# Patient Record
Sex: Male | Born: 1960 | Race: White | Hispanic: No | Marital: Married | Smoking: Never smoker
Health system: Southern US, Community
[De-identification: ages and names within clinical notes are randomized; demographics above are authoritative.]

## PROBLEM LIST (undated history)

## (undated) DIAGNOSIS — M1990 Unspecified osteoarthritis, unspecified site: Secondary | ICD-10-CM

## (undated) DIAGNOSIS — G4730 Sleep apnea, unspecified: Secondary | ICD-10-CM

## (undated) DIAGNOSIS — L0390 Cellulitis, unspecified: Secondary | ICD-10-CM

## (undated) HISTORY — PX: JOINT REPLACEMENT: SHX530

## (undated) HISTORY — PX: OTHER SURGICAL HISTORY: SHX169

## (undated) HISTORY — PX: CARPAL TUNNEL RELEASE: SHX101

---

## 2014-06-03 NOTE — Progress Notes (Signed)
Called Dr. Blackman's office requested order be put in Epic surgery 06-14-14 pre op 06-06-14 Thanks 

## 2014-06-04 ENCOUNTER — Other Ambulatory Visit (HOSPITAL_COMMUNITY): Payer: Self-pay | Admitting: Orthopaedic Surgery

## 2014-06-04 NOTE — Patient Instructions (Addendum)
Jon MassyRobert B Rice  06/04/2014   Your procedure is scheduled on: 06/14/2014    [facility] and follow signs to               Short Stay Center at     0530 AM.  Call this number if you have problems the morning of surgery [phone]   Remember:  Do not eat food or drink liquids :After Midnight.     Take these medicines the morning of surgery with A SIP OF WATER: Zantac                                You may not have any metal on your body including hair pins and              piercings  Do not wear jewelry, , lotions, powders or perfumes.                        Men may shave face and neck.   Do not bring valuables to the hospital. [facility] IS NOT             RESPONSIBLE   FOR VALUABLES.  Contacts, dentures or bridgework may not be worn into surgery.  Leave suitcase in the car. After surgery it may be brought to your room.         Special Instructions: coughing and deep breathing exercises              Please read over the following fact sheets you were given: _____________________________________________________________________             [facility] - Preparing for Surgery Before surgery, you can play an important role.  Because skin is not sterile, your skin needs to be as free of germs as possible.  You can reduce the number of germs on your skin by washing with CHG (chlorahexidine gluconate) soap before surgery.  CHG is an antiseptic cleaner which kills germs and bonds with the skin to continue killing germs even after washing. Please DO NOT use if you have an allergy to CHG or antibacterial soaps.  If your skin becomes reddened/irritated stop using the CHG and inform your nurse when you arrive at Short Stay. Do not shave (including legs and underarms) for at least 48 hours prior to the first CHG shower.  You may shave your face/neck. Please follow these instructions carefully:  1.  Shower with CHG Soap the night before  surgery and the  morning of Surgery.  2.  If you choose to wash your hair, wash your hair first as usual with your  normal  shampoo.  3.  After you shampoo, rinse your hair and body thoroughly to remove the  shampoo.                           4.  Use CHG as you would any other liquid soap.  You can apply chg directly  to the skin and wash                       Gently with a scrungie or clean washcloth.  5.  Apply the CHG Soap to your body ONLY FROM THE NECK DOWN.   Do not  use on face/ open                           Wound or open sores. Avoid contact with eyes, ears mouth and genitals (private parts).                       Wash face,  Genitals (private parts) with your normal soap.             6.  Wash thoroughly, paying special attention to the area where your surgery  will be performed.  7.  Thoroughly rinse your body with warm water from the neck down.  8.  DO NOT shower/wash with your normal soap after using and rinsing off  the CHG Soap.                9.  Pat yourself dry with a clean towel.            10.  Wear clean pajamas.            11.  Place clean sheets on your bed the night of your first shower and do not  sleep with pets. Day of Surgery : Do not apply any lotions/deodorants the morning of surgery.  Please wear clean clothes to the hospital/surgery center.  FAILURE TO FOLLOW THESE INSTRUCTIONS MAY RESULT IN THE CANCELLATION OF YOUR SURGERY PATIENT SIGNATURE_________________________________  NURSE SIGNATURE__________________________________  ________________________________________________________________________  WHAT IS A BLOOD TRANSFUSION? Blood Transfusion Information  A transfusion is the replacement of blood or some of its parts. Blood is made up of multiple cells which provide different functions.  Red blood cells carry oxygen and are used for blood loss replacement.  White blood cells fight against infection.  Platelets control bleeding.  Plasma helps clot  blood.  Other blood products are available for specialized needs, such as hemophilia or other clotting disorders. BEFORE THE TRANSFUSION  Who gives blood for transfusions?   Healthy volunteers who are fully evaluated to make sure their blood is safe. This is blood bank blood. Transfusion therapy is the safest it has ever been in the practice of medicine. Before blood is taken from a donor, a complete history is taken to make sure that person has no history of diseases nor engages in risky social behavior (examples are intravenous drug use or sexual activity with multiple partners). The donor's travel history is screened to minimize risk of transmitting infections, such as malaria. The donated blood is tested for signs of infectious diseases, such as HIV and hepatitis. The blood is then tested to be sure it is compatible with you in order to minimize the chance of a transfusion reaction. If you or a relative donates blood, this is often done in anticipation of surgery and is not appropriate for emergency situations. It takes many days to process the donated blood. RISKS AND COMPLICATIONS Although transfusion therapy is very safe and saves many lives, the main dangers of transfusion include:   Getting an infectious disease.  Developing a transfusion reaction. This is an allergic reaction to something in the blood you were given. Every precaution is taken to prevent this. The decision to have a blood transfusion has been considered carefully by your caregiver before blood is given. Blood is not given unless the benefits outweigh the risks. AFTER THE TRANSFUSION  Right after receiving a blood transfusion, you will usually feel much better and more energetic. This is especially true if  your red blood cells have gotten low (anemic). The transfusion raises the level of the red blood cells which carry oxygen, and this usually causes an energy increase.  The nurse administering the transfusion will monitor  you carefully for complications. HOME CARE INSTRUCTIONS  No special instructions are needed after a transfusion. You may find your energy is better. Speak with your caregiver about any limitations on activity for underlying diseases you may have. SEEK MEDICAL CARE IF:   Your condition is not improving after your transfusion.  You develop redness or irritation at the intravenous (IV) site. SEEK IMMEDIATE MEDICAL CARE IF:  Any of the following symptoms occur over the next 12 hours:  Shaking chills.  You have a temperature by mouth above 102 F (38.9 C), not controlled by medicine.  Chest, back, or muscle pain.  People around you feel you are not acting correctly or are confused.  Shortness of breath or difficulty breathing.  Dizziness and fainting.  You get a rash or develop hives.  You have a decrease in urine output.  Your urine turns a dark color or changes to pink, red, or brown. Any of the following symptoms occur over the next 10 days:  You have a temperature by mouth above 102 F (38.9 C), not controlled by medicine.  Shortness of breath.  Weakness after normal activity.  The white part of the eye turns yellow (jaundice).  You have a decrease in the amount of urine or are urinating less often.  Your urine turns a dark color or changes to pink, red, or brown. Document Released: 05/07/2000 Document Revised: 08/02/2011 Document Reviewed: 12/25/2007 [facility] Patient Information 2014 [place], [place].  _______________________________________________________________________

## 2014-06-04 NOTE — Progress Notes (Signed)
Surgery on 06/14/13.  Preop on 06/06/14.  Need orders in EPIC.  Thank You.

## 2014-06-06 ENCOUNTER — Encounter (HOSPITAL_COMMUNITY)
Admission: RE | Admit: 2014-06-06 | Discharge: 2014-06-06 | Disposition: A | Discharge status: Home / Self Care | Payer: Managed Care, Other (non HMO) | Source: Ambulatory Visit | Attending: Orthopaedic Surgery | Admitting: Orthopaedic Surgery

## 2014-06-06 ENCOUNTER — Ambulatory Visit (HOSPITAL_COMMUNITY)
Admission: RE | Admit: 2014-06-06 | Discharge: 2014-06-06 | Disposition: A | Discharge status: Home / Self Care | Payer: Managed Care, Other (non HMO) | Source: Ambulatory Visit | Attending: Orthopaedic Surgery | Admitting: Orthopaedic Surgery

## 2014-06-06 ENCOUNTER — Encounter (HOSPITAL_COMMUNITY): Payer: Self-pay

## 2014-06-06 DIAGNOSIS — Z01818 Encounter for other preprocedural examination: Secondary | ICD-10-CM | POA: Insufficient documentation

## 2014-06-06 DIAGNOSIS — I10 Essential (primary) hypertension: Secondary | ICD-10-CM | POA: Insufficient documentation

## 2014-06-06 HISTORY — DX: Sleep apnea, unspecified: G47.30

## 2014-06-06 HISTORY — DX: Unspecified osteoarthritis, unspecified site: M19.90

## 2014-06-06 LAB — SURGICAL PCR SCREEN
MRSA, PCR: NEGATIVE
Staphylococcus aureus: NEGATIVE

## 2014-06-06 LAB — BASIC METABOLIC PANEL
ANION GAP: 11 (ref 5–15)
BUN: 10 mg/dL (ref 6–23)
CO2: 31 mmol/L (ref 19–32)
Calcium: 9.7 mg/dL (ref 8.4–10.5)
Chloride: 98 mEq/L (ref 96–112)
Creatinine, Ser: 0.91 mg/dL (ref 0.50–1.35)
GFR calc non Af Amer: >90 mL/min (ref >90)
GLUCOSE: 95 mg/dL (ref 70–99)
Potassium: 3.4 mmol/L — ABNORMAL LOW (ref 3.5–5.1)
SODIUM: 140 mmol/L (ref 135–145)

## 2014-06-06 LAB — PROTIME-INR
INR: 0.97 (ref 0.00–1.49)
Prothrombin Time: 13 seconds (ref 11.6–15.2)

## 2014-06-06 LAB — URINALYSIS, ROUTINE W REFLEX MICROSCOPIC
Bilirubin Urine: NEGATIVE
Glucose, UA: NEGATIVE mg/dL
HGB URINE DIPSTICK: NEGATIVE
Ketones, ur: NEGATIVE mg/dL
Leukocytes, UA: NEGATIVE
Nitrite: NEGATIVE
PH: 7 (ref 5.0–8.0)
Protein, ur: NEGATIVE mg/dL
Specific Gravity, Urine: 1.01 (ref 1.005–1.030)
Urobilinogen, UA: 0.2 mg/dL (ref 0.0–1.0)

## 2014-06-06 LAB — CBC
HCT: 51.2 % (ref 39.0–52.0)
HEMOGLOBIN: 17.4 g/dL — AB (ref 13.0–17.0)
MCH: 31.9 pg (ref 26.0–34.0)
MCHC: 34 g/dL (ref 30.0–36.0)
MCV: 93.8 fL (ref 78.0–100.0)
Platelets: 341 10*3/uL (ref 150–400)
RBC: 5.46 MIL/uL (ref 4.22–5.81)
RDW: 13.2 % (ref 11.5–15.5)
WBC: 12.2 10*3/uL — ABNORMAL HIGH (ref 4.0–10.5)

## 2014-06-06 LAB — APTT: APTT: 30 s (ref 24–37)

## 2014-06-06 NOTE — Progress Notes (Signed)
CBC doen 06/06/2014 faxed via EPIC to Dr Allie Bossierhris Blackman.

## 2014-06-06 NOTE — Progress Notes (Signed)
Final EKG done 06/06/14 in EPIC.

## 2014-06-14 ENCOUNTER — Inpatient Hospital Stay (HOSPITAL_COMMUNITY): Payer: Managed Care, Other (non HMO) | Admitting: Anesthesiology

## 2014-06-14 ENCOUNTER — Inpatient Hospital Stay (HOSPITAL_COMMUNITY): Payer: Managed Care, Other (non HMO)

## 2014-06-14 ENCOUNTER — Encounter (HOSPITAL_COMMUNITY)
Admission: RE | Disposition: A | Discharge status: Home Health | Payer: Self-pay | Source: Ambulatory Visit | Attending: Orthopaedic Surgery

## 2014-06-14 ENCOUNTER — Encounter (HOSPITAL_COMMUNITY): Payer: Self-pay | Admitting: *Deleted

## 2014-06-14 ENCOUNTER — Inpatient Hospital Stay (HOSPITAL_COMMUNITY)
Admission: RE | Admit: 2014-06-14 | Discharge: 2014-06-16 | DRG: 470 | Disposition: A | Discharge status: Home Health | Payer: Managed Care, Other (non HMO) | Source: Ambulatory Visit | Attending: Orthopaedic Surgery | Admitting: Orthopaedic Surgery

## 2014-06-14 DIAGNOSIS — M1612 Unilateral primary osteoarthritis, left hip: Secondary | ICD-10-CM | POA: Diagnosis present

## 2014-06-14 DIAGNOSIS — M25552 Pain in left hip: Secondary | ICD-10-CM | POA: Diagnosis present

## 2014-06-14 DIAGNOSIS — Z87891 Personal history of nicotine dependence: Secondary | ICD-10-CM | POA: Diagnosis not present

## 2014-06-14 DIAGNOSIS — Z419 Encounter for procedure for purposes other than remedying health state, unspecified: Secondary | ICD-10-CM

## 2014-06-14 DIAGNOSIS — Z6841 Body Mass Index (BMI) 40.0 and over, adult: Secondary | ICD-10-CM | POA: Diagnosis not present

## 2014-06-14 DIAGNOSIS — G4730 Sleep apnea, unspecified: Secondary | ICD-10-CM | POA: Diagnosis present

## 2014-06-14 DIAGNOSIS — Z96641 Presence of right artificial hip joint: Secondary | ICD-10-CM | POA: Diagnosis present

## 2014-06-14 DIAGNOSIS — Z96642 Presence of left artificial hip joint: Secondary | ICD-10-CM

## 2014-06-14 DIAGNOSIS — Z79899 Other long term (current) drug therapy: Secondary | ICD-10-CM

## 2014-06-14 HISTORY — PX: TOTAL HIP ARTHROPLASTY: SHX124

## 2014-06-14 LAB — TYPE AND SCREEN
ABO/RH(D): A POS
Antibody Screen: NEGATIVE

## 2014-06-14 LAB — ABO/RH: ABO/RH(D): A POS

## 2014-06-14 SURGERY — ARTHROPLASTY, HIP, TOTAL, ANTERIOR APPROACH
Anesthesia: Spinal | Site: Hip | Laterality: Left

## 2014-06-14 MED ORDER — METOCLOPRAMIDE HCL 10 MG PO TABS: 5.0000 mg | ORAL_TABLET | Freq: Three times a day (TID) | ORAL | Status: DC | PRN

## 2014-06-14 MED ORDER — FENTANYL CITRATE 0.05 MG/ML IJ SOLN
INTRAMUSCULAR | Status: DC | PRN
  Administered 2014-06-14: 100 ug via INTRAVENOUS

## 2014-06-14 MED ORDER — ACETAMINOPHEN 650 MG RE SUPP: 650.0000 mg | Freq: Four times a day (QID) | RECTAL | Status: DC | PRN

## 2014-06-14 MED ORDER — ASPIRIN EC 325 MG PO TBEC
325.0000 mg | DELAYED_RELEASE_TABLET | Freq: Two times a day (BID) | ORAL | Status: DC
  Administered 2014-06-14 – 2014-06-16 (×4): 325 mg via ORAL
  Filled 2014-06-14 (×6): qty 1

## 2014-06-14 MED ORDER — DOCUSATE SODIUM 100 MG PO CAPS
100.0000 mg | ORAL_CAPSULE | Freq: Two times a day (BID) | ORAL | Status: DC
  Administered 2014-06-14 – 2014-06-16 (×4): 100 mg via ORAL

## 2014-06-14 MED ORDER — MIDAZOLAM HCL 2 MG/2ML IJ SOLN
INTRAMUSCULAR | Status: AC
  Filled 2014-06-14: qty 2

## 2014-06-14 MED ORDER — SORBITOL 70 % SOLN
30.0000 mL | Freq: Every day | Status: DC | PRN
  Filled 2014-06-14: qty 30

## 2014-06-14 MED ORDER — ATENOLOL 100 MG PO TABS
100.0000 mg | ORAL_TABLET | Freq: Every day | ORAL | Status: DC
  Administered 2014-06-14 – 2014-06-15 (×2): 100 mg via ORAL
  Filled 2014-06-14 (×3): qty 1

## 2014-06-14 MED ORDER — PHENYLEPHRINE 40 MCG/ML (10ML) SYRINGE FOR IV PUSH (FOR BLOOD PRESSURE SUPPORT)
PREFILLED_SYRINGE | INTRAVENOUS | Status: AC
  Filled 2014-06-14: qty 10

## 2014-06-14 MED ORDER — GABAPENTIN 300 MG PO CAPS
300.0000 mg | ORAL_CAPSULE | Freq: Two times a day (BID) | ORAL | Status: DC
  Administered 2014-06-14 – 2014-06-16 (×4): 300 mg via ORAL
  Filled 2014-06-14 (×5): qty 1

## 2014-06-14 MED ORDER — CEFAZOLIN SODIUM 10 G IJ SOLR
3.0000 g | INTRAMUSCULAR | Status: AC
  Administered 2014-06-14: 3 g via INTRAVENOUS
  Filled 2014-06-14 (×2): qty 3000

## 2014-06-14 MED ORDER — HYDROMORPHONE HCL 1 MG/ML IJ SOLN: 1.0000 mg | INTRAMUSCULAR | Status: DC | PRN

## 2014-06-14 MED ORDER — ZOLPIDEM TARTRATE 5 MG PO TABS: 5.0000 mg | ORAL_TABLET | Freq: Every evening | ORAL | Status: DC | PRN

## 2014-06-14 MED ORDER — LACTATED RINGERS IV SOLN: INTRAVENOUS | Status: DC

## 2014-06-14 MED ORDER — PROPOFOL 10 MG/ML IV BOLUS
INTRAVENOUS | Status: AC
  Filled 2014-06-14: qty 20

## 2014-06-14 MED ORDER — BUPIVACAINE HCL (PF) 0.5 % IJ SOLN
INTRAMUSCULAR | Status: DC | PRN
  Administered 2014-06-14: 3 mL

## 2014-06-14 MED ORDER — CEFAZOLIN SODIUM-DEXTROSE 2-3 GM-% IV SOLR
2.0000 g | Freq: Four times a day (QID) | INTRAVENOUS | Status: AC
  Administered 2014-06-14 (×2): 2 g via INTRAVENOUS
  Filled 2014-06-14 (×2): qty 50

## 2014-06-14 MED ORDER — HYDROMORPHONE HCL 1 MG/ML IJ SOLN
0.2500 mg | INTRAMUSCULAR | Status: DC | PRN
  Administered 2014-06-14: 0.5 mg via INTRAVENOUS

## 2014-06-14 MED ORDER — ONDANSETRON HCL 4 MG/2ML IJ SOLN
INTRAMUSCULAR | Status: AC
  Filled 2014-06-14: qty 2

## 2014-06-14 MED ORDER — LIDOCAINE HCL (CARDIAC) 20 MG/ML IV SOLN
INTRAVENOUS | Status: AC
  Filled 2014-06-14: qty 5

## 2014-06-14 MED ORDER — PHENYLEPHRINE HCL 10 MG/ML IJ SOLN
INTRAMUSCULAR | Status: DC | PRN
  Administered 2014-06-14 (×2): 80 ug via INTRAVENOUS
  Administered 2014-06-14 (×3): 40 ug via INTRAVENOUS

## 2014-06-14 MED ORDER — MENTHOL 3 MG MT LOZG: 1.0000 | LOZENGE | OROMUCOSAL | Status: DC | PRN

## 2014-06-14 MED ORDER — DEXAMETHASONE SODIUM PHOSPHATE 10 MG/ML IJ SOLN
INTRAMUSCULAR | Status: AC
  Filled 2014-06-14: qty 1

## 2014-06-14 MED ORDER — PROPOFOL INFUSION 10 MG/ML OPTIME
INTRAVENOUS | Status: DC | PRN
  Administered 2014-06-14: 50 ug/kg/min via INTRAVENOUS

## 2014-06-14 MED ORDER — ONDANSETRON HCL 4 MG PO TABS: 4.0000 mg | ORAL_TABLET | Freq: Four times a day (QID) | ORAL | Status: DC | PRN

## 2014-06-14 MED ORDER — 0.9 % SODIUM CHLORIDE (POUR BTL) OPTIME
TOPICAL | Status: DC | PRN
  Administered 2014-06-14: 1000 mL

## 2014-06-14 MED ORDER — DEXTROSE 5 % IV SOLN
500.0000 mg | Freq: Four times a day (QID) | INTRAVENOUS | Status: DC | PRN
  Administered 2014-06-14: 500 mg via INTRAVENOUS
  Filled 2014-06-14 (×2): qty 5

## 2014-06-14 MED ORDER — POLYETHYLENE GLYCOL 3350 17 G PO PACK
17.0000 g | PACK | Freq: Every day | ORAL | Status: DC | PRN
  Administered 2014-06-16: 17 g via ORAL
  Filled 2014-06-14: qty 1

## 2014-06-14 MED ORDER — ONDANSETRON HCL 4 MG/2ML IJ SOLN
4.0000 mg | Freq: Four times a day (QID) | INTRAMUSCULAR | Status: DC | PRN
Start: 2014-06-14 — End: 2014-06-16

## 2014-06-14 MED ORDER — FENTANYL CITRATE 0.05 MG/ML IJ SOLN
INTRAMUSCULAR | Status: AC
  Filled 2014-06-14: qty 2

## 2014-06-14 MED ORDER — HYDROMORPHONE HCL 1 MG/ML IJ SOLN
INTRAMUSCULAR | Status: AC
  Filled 2014-06-14: qty 1

## 2014-06-14 MED ORDER — OXYCODONE HCL 5 MG PO TABS
5.0000 mg | ORAL_TABLET | ORAL | Status: DC | PRN
  Administered 2014-06-14 – 2014-06-16 (×11): 10 mg via ORAL
  Filled 2014-06-14 (×11): qty 2

## 2014-06-14 MED ORDER — LIDOCAINE HCL (CARDIAC) 20 MG/ML IV SOLN
INTRAVENOUS | Status: DC | PRN
  Administered 2014-06-14: 50 mg via INTRAVENOUS

## 2014-06-14 MED ORDER — ONDANSETRON HCL 4 MG/2ML IJ SOLN
INTRAMUSCULAR | Status: DC | PRN
  Administered 2014-06-14: 4 mg via INTRAVENOUS

## 2014-06-14 MED ORDER — METOCLOPRAMIDE HCL 5 MG/ML IJ SOLN: 5.0000 mg | Freq: Three times a day (TID) | INTRAMUSCULAR | Status: DC | PRN

## 2014-06-14 MED ORDER — LACTATED RINGERS IV SOLN
INTRAVENOUS | Status: DC | PRN
  Administered 2014-06-14 (×3): via INTRAVENOUS

## 2014-06-14 MED ORDER — PANTOPRAZOLE SODIUM 40 MG PO TBEC
40.0000 mg | DELAYED_RELEASE_TABLET | Freq: Every day | ORAL | Status: DC
  Administered 2014-06-14 – 2014-06-16 (×3): 40 mg via ORAL
  Filled 2014-06-14 (×3): qty 1

## 2014-06-14 MED ORDER — DIPHENHYDRAMINE HCL 12.5 MG/5ML PO ELIX: 12.5000 mg | ORAL_SOLUTION | ORAL | Status: DC | PRN

## 2014-06-14 MED ORDER — METHOCARBAMOL 500 MG PO TABS: 500.0000 mg | ORAL_TABLET | Freq: Four times a day (QID) | ORAL | Status: DC | PRN

## 2014-06-14 MED ORDER — ACETAMINOPHEN 325 MG PO TABS: 650.0000 mg | ORAL_TABLET | Freq: Four times a day (QID) | ORAL | Status: DC | PRN

## 2014-06-14 MED ORDER — TRANEXAMIC ACID 100 MG/ML IV SOLN
1000.0000 mg | INTRAVENOUS | Status: AC
  Administered 2014-06-14: 1000 mg via INTRAVENOUS
  Filled 2014-06-14: qty 10

## 2014-06-14 MED ORDER — ALUM & MAG HYDROXIDE-SIMETH 200-200-20 MG/5ML PO SUSP: 30.0000 mL | ORAL | Status: DC | PRN

## 2014-06-14 MED ORDER — CHLORTHALIDONE 25 MG PO TABS
25.0000 mg | ORAL_TABLET | Freq: Every day | ORAL | Status: DC
  Administered 2014-06-14 – 2014-06-15 (×2): 25 mg via ORAL
  Filled 2014-06-14 (×3): qty 1

## 2014-06-14 MED ORDER — MIDAZOLAM HCL 5 MG/5ML IJ SOLN
INTRAMUSCULAR | Status: DC | PRN
  Administered 2014-06-14: 2 mg via INTRAVENOUS

## 2014-06-14 MED ORDER — PROMETHAZINE HCL 25 MG/ML IJ SOLN
6.2500 mg | INTRAMUSCULAR | Status: DC | PRN
Start: 2014-06-14 — End: 2014-06-14

## 2014-06-14 MED ORDER — DEXAMETHASONE SODIUM PHOSPHATE 10 MG/ML IJ SOLN
INTRAMUSCULAR | Status: DC | PRN
  Administered 2014-06-14: 10 mg via INTRAVENOUS

## 2014-06-14 MED ORDER — SODIUM CHLORIDE 0.9 % IV SOLN
INTRAVENOUS | Status: DC
  Administered 2014-06-14 – 2014-06-15 (×2): via INTRAVENOUS

## 2014-06-14 MED ORDER — PHENOL 1.4 % MT LIQD: 1.0000 | OROMUCOSAL | Status: DC | PRN

## 2014-06-14 MED ORDER — ATENOLOL-CHLORTHALIDONE 100-25 MG PO TABS: 1.0000 | ORAL_TABLET | Freq: Every day | ORAL | Status: DC

## 2014-06-14 MED ORDER — SODIUM CHLORIDE 0.9 % IR SOLN
Status: DC | PRN
  Administered 2014-06-14: 1000 mL

## 2014-06-14 SURGICAL SUPPLY — 46 items
BAG SPEC THK2 15X12 ZIP CLS (MISCELLANEOUS)
BAG ZIPLOCK 12X15 (MISCELLANEOUS) IMPLANT
BLADE SAW SGTL 18X1.27X75 (BLADE) ×2 IMPLANT
BLADE SAW SGTL 18X1.27X75MM (BLADE) ×1
CAPT HIP TOTAL 2 ×2 IMPLANT
CELLS DAT CNTRL 66122 CELL SVR (MISCELLANEOUS) ×1 IMPLANT
COVER PERINEAL POST (MISCELLANEOUS) ×3 IMPLANT
DRAPE C-ARM 42X120 X-RAY (DRAPES) ×3 IMPLANT
DRAPE STERI IOBAN 125X83 (DRAPES) ×3 IMPLANT
DRAPE U-SHAPE 47X51 STRL (DRAPES) ×9 IMPLANT
DRSG AQUACEL AG ADV 3.5X10 (GAUZE/BANDAGES/DRESSINGS) ×3 IMPLANT
DURAPREP 26ML APPLICATOR (WOUND CARE) ×3 IMPLANT
ELECT BLADE TIP CTD 4 INCH (ELECTRODE) ×3 IMPLANT
ELECT REM PT RETURN 9FT ADLT (ELECTROSURGICAL) ×3
ELECTRODE REM PT RTRN 9FT ADLT (ELECTROSURGICAL) ×1 IMPLANT
FACESHIELD WRAPAROUND (MASK) ×12 IMPLANT
FACESHIELD WRAPAROUND OR TEAM (MASK) ×4 IMPLANT
GAUZE XEROFORM 1X8 LF (GAUZE/BANDAGES/DRESSINGS) ×2 IMPLANT
GLOVE BIO SURGEON STRL SZ7 (GLOVE) ×2 IMPLANT
GLOVE BIO SURGEON STRL SZ7.5 (GLOVE) ×5 IMPLANT
GLOVE BIOGEL PI IND STRL 6.5 (GLOVE) IMPLANT
GLOVE BIOGEL PI IND STRL 7.0 (GLOVE) IMPLANT
GLOVE BIOGEL PI IND STRL 7.5 (GLOVE) IMPLANT
GLOVE BIOGEL PI IND STRL 8 (GLOVE) ×2 IMPLANT
GLOVE BIOGEL PI INDICATOR 6.5 (GLOVE) ×2
GLOVE BIOGEL PI INDICATOR 7.0 (GLOVE) ×2
GLOVE BIOGEL PI INDICATOR 7.5 (GLOVE) ×2
GLOVE BIOGEL PI INDICATOR 8 (GLOVE) ×4
GLOVE ECLIPSE 8.0 STRL XLNG CF (GLOVE) ×3 IMPLANT
GOWN STRL REUS W/TWL XL LVL3 (GOWN DISPOSABLE) ×10 IMPLANT
HANDPIECE INTERPULSE COAX TIP (DISPOSABLE) ×3
KIT BASIN OR (CUSTOM PROCEDURE TRAY) ×3 IMPLANT
PACK TOTAL JOINT (CUSTOM PROCEDURE TRAY) ×3 IMPLANT
RETRACTOR WND ALEXIS 18 MED (MISCELLANEOUS) ×1 IMPLANT
RTRCTR WOUND ALEXIS 18CM MED (MISCELLANEOUS) ×3
SET HNDPC FAN SPRY TIP SCT (DISPOSABLE) ×1 IMPLANT
STAPLER VISISTAT 35W (STAPLE) ×2 IMPLANT
SUT ETHIBOND NAB CT1 #1 30IN (SUTURE) ×3 IMPLANT
SUT MNCRL AB 4-0 PS2 18 (SUTURE) IMPLANT
SUT VIC AB 0 CT1 36 (SUTURE) ×5 IMPLANT
SUT VIC AB 1 CT1 36 (SUTURE) ×5 IMPLANT
SUT VIC AB 2-0 CT1 27 (SUTURE) ×6
SUT VIC AB 2-0 CT1 TAPERPNT 27 (SUTURE) ×2 IMPLANT
TOWEL OR 17X26 10 PK STRL BLUE (TOWEL DISPOSABLE) ×3 IMPLANT
TOWEL OR NON WOVEN STRL DISP B (DISPOSABLE) ×3 IMPLANT
TRAY FOLEY CATH 16FRSI W/METER (SET/KITS/TRAYS/PACK) ×2 IMPLANT

## 2014-06-14 NOTE — Progress Notes (Signed)
O.K. To go to floor now- per Dr. Council Mechanicenenny.

## 2014-06-14 NOTE — Progress Notes (Signed)
Portable AP Pelvis X-RAY done. 

## 2014-06-14 NOTE — Plan of Care (Signed)
Problem: Consults Goal: Diagnosis- Total Joint Replacement Left anterior hip     

## 2014-06-14 NOTE — Evaluation (Signed)
Physical Therapy Evaluation Patient Details Name: LORAN FLEET MRN: [id number] DOB: 01-07-1961 Today's Date: 06/14/2014   History of Present Illness  [name]  Clinical Impression  Patient tolerated ambulation very well and felt better OOB. Pt will benefit from PT to address problems listed below. Will require bariatric RW and 3in1    Follow Up Recommendations Home health PT;Supervision/Assistance - 24 hour    Equipment Recommendations  Rolling walker with 5" wheels (bariatric)    Recommendations for Other Services       Precautions / Restrictions Precautions Precautions: Fall      Mobility  Bed Mobility Overal bed mobility: Needs Assistance Bed Mobility: Supine to Sit     Supine to sit: Mod assist;HOB elevated     General bed mobility comments: assist with L leg  to edge and  use of trapeze to sit upright  Transfers Overall transfer level: Needs assistance Equipment used: Rolling walker (2 wheeled) Transfers: Sit to/from Stand Sit to Stand: Min guard;From elevated surface         General transfer comment: cues for hand and LLE  Ambulation/Gait Ambulation/Gait assistance: Min guard Ambulation Distance (Feet): 90 Feet Assistive device: Rolling walker (2 wheeled) Gait Pattern/deviations: Step-to pattern;Step-through pattern;Wide base of support     General Gait Details: cues for sequence  Stairs            Wheelchair Mobility    Modified Rankin (Stroke Patients Only)       Balance                                             Pertinent Vitals/Pain Pain Assessment: 0-10 Pain Score: 3  Pain Location: L hip Pain Descriptors / Indicators: Discomfort    Home Living Family/patient expects to be discharged to:: Private residence Living Arrangements: Spouse/significant other Available Help at Discharge: Family Type of Home: House Home Access: Stairs to enter Entrance Stairs-Rails: None Entrance Stairs-Number of Steps: 1 x  2 Home Layout: One level Home Equipment: None      Prior Function Level of Independence: Independent               Hand Dominance        Extremity/Trunk Assessment   Upper Extremity Assessment: Overall WFL for tasks assessed           Lower Extremity Assessment: LLE deficits/detail   LLE Deficits / Details: able to advance l leg     Communication   Communication: No difficulties  Cognition Arousal/Alertness: Awake/alert Behavior During Therapy: WFL for tasks assessed/performed Overall Cognitive Status: Within Functional Limits for tasks assessed                      General Comments      Exercises        Assessment/Plan    PT Assessment Patient needs continued PT services  PT Diagnosis Difficulty walking   PT Problem List Decreased strength;Decreased range of motion;Decreased activity tolerance;Decreased mobility;Decreased knowledge of precautions;Decreased safety awareness;Decreased knowledge of use of DME;Pain  PT Treatment Interventions DME instruction;Gait training;Functional mobility training;Therapeutic activities;Therapeutic exercise;Patient/family education   PT Goals (Current goals can be found in the Care Plan section) Acute Rehab PT Goals Patient Stated Goal: to walk without pain PT Goal Formulation: With patient Time For Goal Achievement: 06/17/14 Potential to Achieve Goals: Good    Frequency 7X/week  Barriers to discharge        Co-evaluation               End of Session   Activity Tolerance: Patient tolerated treatment well Patient left: in chair;with call bell/phone within reach Nurse Communication: Mobility status         Time: 1540-1603 PT Time Calculation (min) (ACUTE ONLY): 23 min   Charges:   PT Evaluation $Initial PT Evaluation Tier I: 1 Procedure PT Treatments $Gait Training: 23-37 mins   PT G Codes:        Rada HayHill, Nealy Hickmon Elizabeth 06/14/2014, 4:55 PM Blanchard KelchKaren Namari Breton PT [phone]

## 2014-06-14 NOTE — Anesthesia Postprocedure Evaluation (Signed)
  Anesthesia Post-op Note  Patient: Jon MassyRobert B Rice  Procedure(s) Performed: Procedure(s) (LRB): LEFT TOTAL HIP ARTHROPLASTY ANTERIOR APPROACH (Left)  Patient Location: PACU  Anesthesia Type: Spinal  Level of Consciousness: awake and alert   Airway and Oxygen Therapy: Patient Spontanous Breathing  Post-op Pain: mild  Post-op Assessment: Post-op Vital signs reviewed, Patient's Cardiovascular Status Stable, Respiratory Function Stable, Patent Airway and No signs of Nausea or vomiting  Last Vitals:  Filed Vitals:   06/14/14 1030  BP: 130/67  Pulse: 75  Temp: 36.7 C  Resp: 15    Post-op Vital Signs: stable   Complications: No apparent anesthesia complications. Moving both feet. OSA precautions on floor tonight.

## 2014-06-14 NOTE — Anesthesia Procedure Notes (Signed)
Spinal Patient location during procedure: OR End time: 06/14/2014 7:25 AM Staffing Resident/CRNA: Noralyn Pick Performed by: anesthesiologist and resident/CRNA  Preanesthetic Checklist Completed: patient identified, site marked, surgical consent, pre-op evaluation, timeout performed, IV checked, risks and benefits discussed and monitors and equipment checked Spinal Block Patient position: sitting Prep: Betadine Patient monitoring: heart rate, continuous pulse ox and blood pressure Approach: midline Location: L2-3 Injection technique: single-shot Needle Needle type: Sprotte and Pencil-Tip  Needle gauge: 24 G Needle length: 12.7 cm Assessment Sensory level: T6 Additional Notes Expiration date of kit checked and confirmed. Patient tolerated procedure well, without complications.

## 2014-06-14 NOTE — Anesthesia Preprocedure Evaluation (Addendum)
Anesthesia Evaluation  Patient identified by MRN, date of birth, ID band Patient awake    Reviewed: Allergy & Precautions, NPO status , Patient's Chart, lab work & pertinent test results  Airway Mallampati: II  TM Distance: >3 FB Neck ROM: Full    Dental no notable dental hx.    Pulmonary sleep apnea ,  breath sounds clear to auscultation  Pulmonary exam normal       Cardiovascular negative cardio ROS  Rhythm:Regular Rate:Normal     Neuro/Psych negative neurological ROS  negative psych ROS   GI/Hepatic negative GI ROS, Neg liver ROS,   Endo/Other  Morbid obesity  Renal/GU negative Renal ROS  negative genitourinary   Musculoskeletal  (+) Arthritis -,   Abdominal (+) + obese,   Peds negative pediatric ROS (+)  Hematology negative hematology ROS (+)   Anesthesia Other Findings   Reproductive/Obstetrics negative OB ROS                             Anesthesia Physical Anesthesia Plan  ASA: III  Anesthesia Plan: Spinal   Post-op Pain Management:    Induction: Intravenous  Airway Management Planned:   Additional Equipment:   Intra-op Plan:   Post-operative Plan: Extubation in OR  Informed Consent: I have reviewed the patients History and Physical, chart, labs and discussed the procedure including the risks, benefits and alternatives for the proposed anesthesia with the patient or authorized representative who has indicated his/her understanding and acceptance.   Dental advisory given  Plan Discussed with: CRNA  Anesthesia Plan Comments: (Discussed spinal and general. Discussed risks/benefits of spinal including headache, backache, failure, bleeding, infection, and nerve damage. Patient consents to spinal. Questions answered. Coagulation studies and platelet count acceptable.)       Anesthesia Quick Evaluation

## 2014-06-14 NOTE — Op Note (Signed)
NAMETIVIS, WHERRY NO.:  [id number]  MEDICAL RECORD NO.:  [id number]  LOCATION:  [facility]                         FACILITY:  [facility]  PHYSICIAN:  Vanita Panda. Magnus Ivan, M.D.DATE OF BIRTH:  1960/06/05  DATE OF PROCEDURE:  06/14/2014 DATE OF DISCHARGE:                              OPERATIVE REPORT   PREOPERATIVE DIAGNOSES: 1. Primary osteoarthritis and degenerative joint disease, left hip. 2. Morbid obesity with weight in pounds of 385.  POSTOPERATIVE DIAGNOSES: 1. Primary osteoarthritis and degenerative joint disease, left hip. 2. Morbid obesity with weight in pounds of 385.  PROCEDURE:  Left total hip arthroplasty through direct anterior approach.  IMPLANTS:  DePuy Sector Gription acetabular component size 52 with apex hole eliminator, size 36+ 4 neutral polyethylene liner for size 52 acetabular component, size 11 Corail femoral component with standard offset, size 36- 2 metal hip ball.  SURGEON:  Vanita Panda. Magnus Ivan, M.D.  ASSISTANT:  Richardean Canal, PA-C  ANESTHESIA:  Spinal.  ANTIBIOTICS:  2 g of IV Ancef.  BLOOD LOSS:  600 mL.  COMPLICATIONS:  None.  INDICATIONS:  Mr. Jon Rice is a 54 year old gentleman with primary osteoarthritis involving his left hip.  He is morbidly obese and surprisingly had a hip resurfacing arthroplasty in 2007 on his right hip that has still done well, this was done elsewhere.  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  He cannot lose weight due to the pain and his impact on his activities of daily living, his quality of life, and his mobility.  X-rays do confirm severe primary osteoarthritis of his left hip with periarticular osteophytes, sclerotic changes, and significant narrowing of the joint.  At this point, he does wish to proceed with a total hip arthroplasty.  He understands the biggest risk of this relates to his high body mass  index, which has a higher complication rate for infection, blood clots, and malpositioning of the implants as well as early wear on the implants causing failure.  In spite of these risks, he does wish to proceed with surgery with the goals of decreased pain, improved mobility, and overall improved quality of life.  PROCEDURE DESCRIPTION:  After informed consent was obtained, appropriate left hip was marked.  He was brought to the operating room and spinal anesthesia was obtained while he was on the stretcher.  Traction boots were placed on both his feet.  Foley catheter was placed as well.  Next, he was placed supine on the HANA fracture table with the perineal post in place and both legs in inline skeletal traction devices, but no traction applied.  His left operative hip was then prepped and draped with DuraPrep and sterile drapes.  A time-out was called, he was identified as correct patient, correct left hip.  I then made an incision inferior and posterior to the anterior-superior iliac spine and carried this obliquely down the leg.  I dissected down to the tensor fascia lata muscle and tensor fascia was divided so we could proceed with a direct anterior approach to the hip.  We cauterized the lateral femoral circumflex vessels and then placed Cobra retractor  on the medial and lateral femoral necks with the medial one up underneath the rectus femoris.  We then opened up the hip capsule in a L type format and found a large joint effusion and found significant periarticular osteophytes. I used an oscillating saw to make our femoral neck cut proximal to the lesser trochanter, but a high neck cut corresponding with his opposite side from his hip resurfacing arthroplasty.  We made this cut with an oscillating saw and completed this with an osteotome.  I placed a corkscrew guide in the femoral head and removed the femoral head in its entirety and found it to be devoid of cartilage.  We then  cleaned the acetabulum of debris including remnants of acetabular labrum.  I placed a bent Hohmann medially and a Cobra retractor laterally.  We then began reaming under direct visualization in 2 mm increments from a size 42 reamer up to a size 52, the last reamer was also placed under direct fluoroscopy, so we could obtain our depth of reaming, our inclination, and anteversion.  Once I was pleased with this, I placed the real DePuy Sector Gription acetabular component size 52, the apex hole eliminator, and a 36+ 4 neutral polyethylene liner.  Attention was then turned to the femur.  With the leg externally rotated to 100 degrees extended and abducted, we were able to place a Mueller retractor medially and a Hohmann retractor behind the greater trochanter and release the lateral joint capsule and used a box cutting osteotome to enter the femoral canal and a rongeur to lateralize and then began broaching using the Corail broaching system from DePuy from a size 8 broach up to a size 11. With the size 11, we trialed a standard neck and a 36- 2 hip ball due to the fact that we had a high femoral neck cut and he does have a metal-on- metal implant on the other side.  This would give a metal on polyethylene.  We brought the leg back over and up and with traction and internal rotation, reduced the pelvis and her leg lengths were measured to be near equal if not just slightly long.  His offsets were adequate too, his range of motion was stable past 95 degrees of external rotation, 40 degrees of internal rotation with minimal shuck.  We then dislocated the hip and removed the trial components.  I then placed a real Corail femoral component size 11, the real 36- 2 metal hip ball and reduced this back in the acetabulum and it was stable.  We then copiously irrigated the soft tissues with normal saline solution.  We did not close the joint capsule.  We closed the iliotibial band with interrupted #1  Vicryl suture followed by 0-Vicryl in the deep tissue, 2- 0 Vicryl in the subcutaneous tissue, and staples on the skin.  Of note, Richardean CanalGilbert Clark, PA-C assisted during the entire case and his assistance was crucial for facilitating all aspects of this case on this morbidly obese individual.     Vanita Pandahristopher Y. Magnus IvanBlackman, M.D.     CYB/MEDQ  D:  06/14/2014  T:  06/14/2014  Job:  [id number]

## 2014-06-14 NOTE — H&P (Signed)
TOTAL HIP ADMISSION H&P  Patient is admitted for left total hip arthroplasty.  Subjective:  Chief Complaint: left hip pain  HPI: Jon Rice, 54 y.o. male, has a history of pain and functional disability in the left hip(s) due to arthritis and patient has failed non-surgical conservative treatments for greater than 12 weeks to include NSAID's and/or analgesics, flexibility and strengthening excercises, use of assistive devices, weight reduction as appropriate and activity modification.  Onset of symptoms was abrupt starting 1 years ago with rapidlly worsening course since that time.The patient noted no past surgery on the left hip(s).  Patient currently rates pain in the left hip at 9 out of 10 with activity. Patient has worsening of pain with activity and weight bearing, pain that interfers with activities of daily living and pain with passive range of motion. Patient has evidence of subchondral sclerosis, periarticular osteophytes and joint space narrowing by imaging studies. This condition presents safety issues increasing the risk of falls.  There is no current active infection.  Patient Active Problem List   Diagnosis Date Noted  . Osteoarthritis of left hip 06/14/2014   Past Medical History  Diagnosis Date  . Sleep apnea     cpap- does not know settings   . Arthritis     Past Surgical History  Procedure Laterality Date  . Joint replacement      right hip replacement   . Right hand surgery     . Right wrist surgery       as a child   . Carpal tunnel release      right     Prescriptions prior to admission  Medication Sig Dispense Refill Last Dose  . atenolol-chlorthalidone (TENORETIC) 100-25 MG per tablet Take 1 tablet by mouth at bedtime.   06/13/2014 at 1930  . diclofenac (VOLTAREN) 50 MG EC tablet Take 50 mg by mouth 2 (two) times daily.   06/07/2014  . gabapentin (NEURONTIN) 300 MG capsule Take 300 mg by mouth 2 (two) times daily.   06/12/2014  . psyllium  (HYDROCIL/METAMUCIL) 95 % PACK Take 1 packet by mouth daily.   06/13/2014 at 2200  . ranitidine (ZANTAC) 75 MG tablet Take 75 mg by mouth daily as needed for heartburn.   06/13/2014 at 2200  . traMADol (ULTRAM-ER) 100 MG 24 hr tablet Take 100 mg by mouth at bedtime.   06/13/2014 at 2000   No Known Allergies  History  Substance Use Topics  . Smoking status: Never Smoker   . Smokeless tobacco: Former Neurosurgeon    Types: Snuff  . Alcohol Use: Yes     Comment: 1 glass of wine per week     History reviewed. No pertinent family history.   Review of Systems  Musculoskeletal: Positive for joint pain.  All other systems reviewed and are negative.   Objective:  Physical Exam  Constitutional: He is oriented to person, place, and time. He appears well-developed and well-nourished.  HENT:  Head: Normocephalic and atraumatic.  Eyes: EOM are normal. Pupils are equal, round, and reactive to light.  Neck: Normal range of motion. Neck supple.  Cardiovascular: Normal rate and regular rhythm.   Respiratory: Effort normal.  GI: Soft. Bowel sounds are normal.  Musculoskeletal:       Left hip: He exhibits decreased range of motion, decreased strength and bony tenderness.  Neurological: He is alert and oriented to person, place, and time.  Skin: Skin is warm and dry.  Psychiatric: He has a normal mood  and affect.    Vital signs in last 24 hours: Temp:  [97.7 F (36.5 C)] 97.7 F (36.5 C) (01/22 0510) Pulse Rate:  [89] 89 (01/22 0510) Resp:  [16] 16 (01/22 0510) BP: (131)/(88) 131/88 mmHg (01/22 0510) SpO2:  [97 %] 97 % (01/22 0510) Weight:  [173.274 kg (382 lb)] 173.274 kg (382 lb) (01/22 0547)  Labs:   Estimated body mass index is 54.81 kg/(m^2) as calculated from the following:   Height as of this encounter: 5\' 10"  (1.778 m).   Weight as of this encounter: 173.274 kg (382 lb).   Imaging Review Plain radiographs demonstrate severe degenerative joint disease of the left hip(s). The bone  quality appears to be good for age and reported activity level.  Assessment/Plan:  End stage arthritis, left hip(s)  The patient history, physical examination, clinical judgement of the provider and imaging studies are consistent with end stage degenerative joint disease of the left hip(s) and total hip arthroplasty is deemed medically necessary. The treatment options including medical management, injection therapy, arthroscopy and arthroplasty were discussed at length. The risks and benefits of total hip arthroplasty were presented and reviewed. The risks due to aseptic loosening, infection, stiffness, dislocation/subluxation,  thromboembolic complications and other imponderables were discussed.  The patient acknowledged the explanation, agreed to proceed with the plan and consent was signed. Patient is being admitted for inpatient treatment for surgery, pain control, PT, OT, prophylactic antibiotics, VTE prophylaxis, progressive ambulation and ADL's and discharge planning.The patient is planning to be discharged home with home health services

## 2014-06-14 NOTE — Transfer of Care (Signed)
Immediate Anesthesia Transfer of Care Note  Patient: Jon Rice  Procedure(s) Performed: Procedure(s): LEFT TOTAL HIP ARTHROPLASTY ANTERIOR APPROACH (Left)  Patient Location: PACU  Anesthesia Type:Regional  Level of Consciousness: awake, alert  and oriented  Airway & Oxygen Therapy: Patient Spontanous Breathing and Patient connected to face mask oxygen  Post-op Assessment: Report given to PACU RN and Post -op Vital signs reviewed and stable  Post vital signs: Reviewed and stable  Complications: No apparent anesthesia complications

## 2014-06-14 NOTE — Brief Op Note (Signed)
06/14/2014  9:23 AM  PATIENT:  Jon Rice  54 y.o. male  PRE-OPERATIVE DIAGNOSIS:  Osteoarthritis left hip  POST-OPERATIVE DIAGNOSIS:  Osteoarthritis left hip  PROCEDURE:  Procedure(s): LEFT TOTAL HIP ARTHROPLASTY ANTERIOR APPROACH (Left)  SURGEON:  Surgeon(s) and Role:    * Kathryne Hitchhristopher Y Amen Dargis, MD - Primary  PHYSICIAN ASSISTANT: Rexene EdisonGil Clark, PA-C  ANESTHESIA:   spinal  EBL:  Total I/O In: 2000 [I.V.:2000] Out: 750 [Urine:150; Blood:600]  BLOOD ADMINISTERED:none  DRAINS: none   LOCAL MEDICATIONS USED:  NONE  SPECIMEN:  No Specimen  DISPOSITION OF SPECIMEN:  N/A  COUNTS:  YES  TOURNIQUET:  * No tourniquets in log *  DICTATION: .Other Dictation: Dictation Number [id number]  PLAN OF CARE: Admit to inpatient   PATIENT DISPOSITION:  PACU - hemodynamically stable.   Delay start of Pharmacological VTE agent (>24hrs) due to surgical blood loss or risk of bleeding: no

## 2014-06-14 NOTE — Progress Notes (Signed)
X-ray results noted 

## 2014-06-15 LAB — BASIC METABOLIC PANEL
Anion gap: 8 (ref 5–15)
BUN: 11 mg/dL (ref 6–23)
CHLORIDE: 96 mmol/L (ref 96–112)
CO2: 35 mmol/L — ABNORMAL HIGH (ref 19–32)
Calcium: 9.1 mg/dL (ref 8.4–10.5)
Creatinine, Ser: 0.79 mg/dL (ref 0.50–1.35)
GFR calc Af Amer: >90 mL/min (ref >90)
GLUCOSE: 151 mg/dL — AB (ref 70–99)
Potassium: 4.1 mmol/L (ref 3.5–5.1)
Sodium: 139 mmol/L (ref 135–145)

## 2014-06-15 LAB — CBC
HCT: 42.5 % (ref 39.0–52.0)
Hemoglobin: 14.2 g/dL (ref 13.0–17.0)
MCH: 31.2 pg (ref 26.0–34.0)
MCHC: 33.4 g/dL (ref 30.0–36.0)
MCV: 93.4 fL (ref 78.0–100.0)
Platelets: 259 10*3/uL (ref 150–400)
RBC: 4.55 MIL/uL (ref 4.22–5.81)
RDW: 13.2 % (ref 11.5–15.5)
WBC: 17.4 10*3/uL — ABNORMAL HIGH (ref 4.0–10.5)

## 2014-06-15 NOTE — Progress Notes (Signed)
Physical Therapy Treatment Patient Details Name: Jon Rice MRN: [id number] DOB: 02/27/1961 Today's Date: 06/15/2014    History of Present Illness [name]    PT Comments    Pt tolerated well.  Follow Up Recommendations  Home health PT;Supervision/Assistance - 24 hour     Equipment Recommendations  Rolling walker with 5" wheels    Recommendations for Other Services       Precautions / Restrictions Precautions Precautions: Fall    Mobility  Bed Mobility                  Transfers   Equipment used: Rolling walker (2 wheeled) Transfers: Sit to/from Stand Sit to Stand: Supervision         General transfer comment: from chair; cues for LLE for comfort  Ambulation/Gait Ambulation/Gait assistance: Supervision Ambulation Distance (Feet): 300 Feet Assistive device: Rolling walker (2 wheeled) Gait Pattern/deviations: Step-through pattern     General Gait Details: cues for sequence   Stairs            Wheelchair Mobility    Modified Rankin (Stroke Patients Only)       Balance                                    Cognition Arousal/Alertness: Awake/alert                          Exercises Total Joint Exercises Heel Slides: AAROM;Left;10 reps;Supine;Seated Hip ABduction/ADduction: AAROM;Left;10 reps;Supine;Seated Long Arc Quad: AAROM;Left;10 reps;Supine;Seated    General Comments        Pertinent Vitals/Pain Pain Score: 2  Pain Location: L hip Pain Descriptors / Indicators: Discomfort Pain Intervention(s): Premedicated before session;Ice applied    Home Living                      Prior Function            PT Goals (current goals can now be found in the care plan section) Progress towards PT goals: Progressing toward goals    Frequency  7X/week    PT Plan Current plan remains appropriate    Co-evaluation             End of Session   Activity Tolerance: Patient tolerated  treatment well Patient left: in chair;with call bell/phone within reach     Time: [id number] PT Time Calculation (min) (ACUTE ONLY): 10 min  Charges:  $Gait Training: 8-22 mins                    G Codes:      Jon HayHill, Jon Rice Jon Rice 06/15/2014, 5:43 PM

## 2014-06-15 NOTE — Clinical Social Work Note (Signed)
CSW reviewed PT evaluation dated 06/14/14 which reflected HH PT  No further CSW needs  CSW signing off  .Ina Scrivens, LCSW [facility] Clinical Social Worker - Weekend Coverage cell #: [phone] 

## 2014-06-15 NOTE — Progress Notes (Signed)
Subjective: 1 Day Post-Op Procedure(s) (LRB): LEFT TOTAL HIP ARTHROPLASTY ANTERIOR APPROACH (Left) Patient reports pain as moderate.    Objective: Vital signs in last 24 hours: Temp:  [97.9 F (36.6 C)-98.9 F (37.2 C)] 98.6 F (37 C) (01/23 0556) Pulse Rate:  [71-98] 78 (01/23 0556) Resp:  [11-22] 16 (01/23 0556) BP: (99-161)/(61-87) 112/71 mmHg (01/23 0556) SpO2:  [91 %-100 %] 98 % (01/23 0556) Weight:  [173.274 kg (382 lb)] 173.274 kg (382 lb) (01/22 1110)  Intake/Output from previous day: 01/22 0701 - 01/23 0700 In: 6051.3 [P.O.:1740; I.V.:4156.3; IV Piggyback:155] Out: 5860 [Urine:5260; Blood:600] Intake/Output this shift:     Recent Labs  06/15/14 0441  HGB 14.2    Recent Labs  06/15/14 0441  WBC 17.4*  RBC 4.55  HCT 42.5  PLT 259    Recent Labs  06/15/14 0441  NA 139  K 4.1  CL 96  CO2 35*  BUN 11  CREATININE 0.79  GLUCOSE 151*  CALCIUM 9.1   No results for input(s): LABPT, INR in the last 72 hours.  Sensation intact distally Intact pulses distally Dorsiflexion/Plantar flexion intact Incision: dressing C/D/I  Assessment/Plan: 1 Day Post-Op Procedure(s) (LRB): LEFT TOTAL HIP ARTHROPLASTY ANTERIOR APPROACH (Left) Up with therapy Plan for discharge tomorrow  Kathryne HitchBLACKMAN,CHRISTOPHER Y 06/15/2014, 8:55 AM

## 2014-06-15 NOTE — Progress Notes (Signed)
Physical Therapy Treatment Patient Details Name: Jon MassyRobert B Rice MRN: [id number] DOB: 04/04/1961 Today's Date: 06/15/2014    History of Present Illness [name]    PT Comments    Pt doing very well.  Follow Up Recommendations  Home health PT;Supervision/Assistance - 24 hour     Equipment Recommendations  Rolling walker with 5" wheels    Recommendations for Other Services       Precautions / Restrictions Precautions Precautions: Fall    Mobility  Bed Mobility                  Transfers   Equipment used: Rolling walker (2 wheeled) Transfers: Sit to/from Stand Sit to Stand: Supervision         General transfer comment: from chair; cues for LLE for comfort  Ambulation/Gait Ambulation/Gait assistance: Supervision Ambulation Distance (Feet): 300 Feet Assistive device: Rolling walker (2 wheeled) Gait Pattern/deviations: Step-through pattern     General Gait Details: cues for sequence   Stairs            Wheelchair Mobility    Modified Rankin (Stroke Patients Only)       Balance                                    Cognition Arousal/Alertness: Awake/alert                          Exercises Total Joint Exercises Heel Slides: AAROM;Left;10 reps;Supine;Seated Hip ABduction/ADduction: AAROM;Left;10 reps;Supine;Seated Long Arc Quad: AAROM;Left;10 reps;Supine;Seated    General Comments        Pertinent Vitals/Pain Pain Score: 2  Pain Location: L hip Pain Descriptors / Indicators: Discomfort Pain Intervention(s): Premedicated before session;Ice applied    Home Living                      Prior Function            PT Goals (current goals can now be found in the care plan section) Progress towards PT goals: Progressing toward goals    Frequency  7X/week    PT Plan Current plan remains appropriate    Co-evaluation             End of Session   Activity Tolerance: Patient tolerated  treatment well Patient left: in chair;with call bell/phone within reach     Time: [id number] PT Time Calculation (min) (ACUTE ONLY): 10 min  Charges:  $Gait Training: 8-22 mins                    G Codes:      Jon Rice, Jon Rice Jon Rice 06/15/2014, 5:44 PM

## 2014-06-15 NOTE — Evaluation (Signed)
Occupational Therapy Evaluation Patient Details Name: Jon MassyRobert B Rice MRN: [id number] DOB: 09/27/1960 Today's Date: 06/15/2014    History of Present Illness Jon Rice   Clinical Impression   This 54 year old man was admitted for the above surgery.  He had R replaced approximately 8 years ago through posterior approach.  Reviewed ADLs, bathroom transfers and AE.  Pt verbalizes understanding of all and no further OT is needed at this time.      Follow Up Recommendations  No OT follow up    Equipment Recommendations  None recommended by OT (pt will try standard commode at home and ask HH to get 3:1 if needed)    Recommendations for Other Services       Precautions / Restrictions Precautions Precautions: Fall Restrictions Weight Bearing Restrictions: No      Mobility Bed Mobility               General bed mobility comments: pt up in chair  Transfers   Equipment used: Rolling walker (2 wheeled) Transfers: Sit to/from Stand Sit to Stand: Supervision         General transfer comment: from chair; cues for LLE for comfort    Balance                                            ADL Overall ADL's : Needs assistance/impaired     Grooming: Supervision/safety;Standing       Lower Body Bathing: With adaptive equipment;Sit to/from stand;Supervison/ safety       Lower Body Dressing: Minimal assistance;With adaptive equipment;Sit to/from stand   Toilet Transfer: Supervision/safety;Ambulation;Comfort height toilet             General ADL Comments: pt is able to perform UB adls with set up.  Wife can assist as needed. Pt is interested in reacher and wide sock aide--has used reacher before and used sock aide this session.  He has a tub/shower.  Explained tub readiness and pt will sponge bathe initially.  Pt has a standard commode, but he has a sink next to it and window ledge.  He was able to get up from comfort height with sink and RW. He would  like to try this at home and ask HH to get him 3:1 if needed. Cues for safety, to keep walker in front of him when sidestepping to commode     Vision                     Perception     Praxis      Pertinent Vitals/Pain Pain Score: 2  Pain Location: Jon hip Pain Descriptors / Indicators: Discomfort     Hand Dominance     Extremity/Trunk Assessment             Communication Communication Communication: No difficulties   Cognition Arousal/Alertness: Awake/alert Behavior During Therapy: WFL for tasks assessed/performed                       General Comments       Exercises       Shoulder Instructions      Home Living Family/patient expects to be discharged to:: Private residence Living Arrangements: Spouse/significant other                 Bathroom Shower/Tub: Tub/shower unit Shower/tub characteristics: Curtain  Bathroom Toilet: Standard                Prior Functioning/Environment Level of Independence: Independent             OT Diagnosis: Generalized weakness   OT Problem List:     OT Treatment/Interventions:      OT Goals(Current goals can be found in the care plan section)    OT Frequency:     Barriers to D/C:            Co-evaluation              End of Session    Activity Tolerance: Patient tolerated treatment well Patient left: in chair;with call bell/phone within reach   Time: 0855-0920 OT Time Calculation (min): 25 min Charges:  OT General Charges $OT Visit: 1 Procedure OT Evaluation $Initial OT Evaluation Tier I: 1 Procedure OT Treatments $Self Care/Home Management : 8-22 mins G-Codes:    Jon Rice July 06, 2014, 9:36 AM  Marica Otter, OTR/Jon [phone] 2014-07-06

## 2014-06-15 NOTE — Progress Notes (Signed)
CARE MANAGEMENT NOTE 06/15/2014  Patient:  Jon Rice,Jon Rice   Account Number:  [id number]  Date Initiated:  06/15/2014  Documentation initiated by:  Lanier ClamMAHABIR,Emely Fahy  Subjective/Objective Assessment:   54 y/o m admitted w/OA L hip.     Action/Plan:   From home.   Anticipated DC Date:  06/17/2014   Anticipated DC Plan:  HOME W HOME HEALTH SERVICES      DC Planning Services  CM consult      Choice offered to / List presented to:  C-1 Patient        HH arranged  HH[facility]2 PT      [facility]   Status of service:  In process, will continue to follow Medicare Important Message given?   (If response is "NO", the following Medicare IM given date Sheets will be blank) Date Medicare IM given:   Medicare IM given by:   Date Additional Medicare IM given:   Additional Medicare IM given by:    Discharge Disposition:    Per UR Regulation:    If discussed at Long Length of Stay Meetings, dates discussed:    Comments:  06/15/14 Jon FusiKathy MahabirRN BSN NCM [phone] [facility] rep Lupita LeashDonna aware of HHPT order.

## 2014-06-16 LAB — CBC
HCT: 37.7 % — ABNORMAL LOW (ref 39.0–52.0)
HEMOGLOBIN: 12.5 g/dL — AB (ref 13.0–17.0)
MCH: 31.4 pg (ref 26.0–34.0)
MCHC: 33.2 g/dL (ref 30.0–36.0)
MCV: 94.7 fL (ref 78.0–100.0)
Platelets: 249 10*3/uL (ref 150–400)
RBC: 3.98 MIL/uL — ABNORMAL LOW (ref 4.22–5.81)
RDW: 13.6 % (ref 11.5–15.5)
WBC: 13.3 10*3/uL — AB (ref 4.0–10.5)

## 2014-06-16 MED ORDER — ASPIRIN 325 MG PO TBEC: 325.0000 mg | DELAYED_RELEASE_TABLET | Freq: Two times a day (BID) | ORAL | Status: DC

## 2014-06-16 MED ORDER — METHOCARBAMOL 500 MG PO TABS: 500.0000 mg | ORAL_TABLET | Freq: Four times a day (QID) | ORAL | Status: DC | PRN

## 2014-06-16 MED ORDER — OXYCODONE-ACETAMINOPHEN 5-325 MG PO TABS: 1.0000 | ORAL_TABLET | ORAL | Status: DC | PRN

## 2014-06-16 NOTE — Progress Notes (Signed)
Subjective: 2 Days Post-Op Procedure(s) (LRB): LEFT TOTAL HIP ARTHROPLASTY ANTERIOR APPROACH (Left) Patient reports pain as moderate.  Making great progress.  Objective: Vital signs in last 24 hours: Temp:  [97.7 F (36.5 C)-98.3 F (36.8 C)] 98.3 F (36.8 C) (01/24 0547) Pulse Rate:  [74-89] 89 (01/24 0547) Resp:  [16-20] 18 (01/24 0547) BP: (110-123)/(60-70) 123/60 mmHg (01/24 0547) SpO2:  [92 %-100 %] 95 % (01/24 0547)  Intake/Output from previous day: 01/23 0701 - 01/24 0700 In: 945 [P.O.:720; I.V.:225] Out: -  Intake/Output this shift: Total I/O In: 240 [P.O.:240] Out: -    Recent Labs  06/15/14 0441 06/16/14 0455  HGB 14.2 12.5*    Recent Labs  06/15/14 0441 06/16/14 0455  WBC 17.4* 13.3*  RBC 4.55 3.98*  HCT 42.5 37.7*  PLT 259 249    Recent Labs  06/15/14 0441  NA 139  K 4.1  CL 96  CO2 35*  BUN 11  CREATININE 0.79  GLUCOSE 151*  CALCIUM 9.1   No results for input(s): LABPT, INR in the last 72 hours.  Sensation intact distally Intact pulses distally Dorsiflexion/Plantar flexion intact Incision: scant drainage  Assessment/Plan: 2 Days Post-Op Procedure(s) (LRB): LEFT TOTAL HIP ARTHROPLASTY ANTERIOR APPROACH (Left) Discharge home with home health today.  Trestan Vahle Y 06/16/2014, 8:03 AM

## 2014-06-16 NOTE — Progress Notes (Signed)
Physical Therapy Treatment Patient Details Name: Jon MassyRobert B Rice MRN: [id number] DOB: 09/21/1960 Today's Date: 06/16/2014    History of Present Illness Jon Rice    PT Comments    Pt eager to D/C to home.  OOB in recliner.  Assisted with amb in hallway, practiced going up/down one step using RW then returned to room to perform THR TE's following handout HEP.  Instructed on proper tech and fre plus use of ICE after.  Follow Up Recommendations  Home health PT;Supervision/Assistance - 24 hour     Equipment Recommendations  Rolling walker with 5" wheels    Recommendations for Other Services       Precautions / Restrictions Precautions Precautions: Fall Restrictions Weight Bearing Restrictions: No Other Position/Activity Restrictions: WBAT    Mobility  Bed Mobility               General bed mobility comments: Pt OOB in recliner  Transfers Overall transfer level: Modified independent Equipment used: Rolling walker (2 wheeled) Transfers: Sit to/from Stand           General transfer comment: good safety cognition and use of hands to steady self  Ambulation/Gait Ambulation/Gait assistance: Modified independent (Device/Increase time) Ambulation Distance (Feet): 285 Feet Assistive device: Rolling walker (2 wheeled) Gait Pattern/deviations: Step-through pattern Gait velocity: decreased but functional   General Gait Details: <25% VC's safety with turns and backward gait   Stairs Stairs: Yes Stairs assistance: Supervision Stair Management: No rails;Step to pattern;Forwards;With walker Number of Stairs: 1 General stair comments: performed twice one step with walker and only one initial VC on proper sequencing.   Wheelchair Mobility    Modified Rankin (Stroke Patients Only)       Balance                                    Cognition                            Exercises   Total Hip Replacement TE's 10 reps ankle pumps 10 reps  knee presses 10 reps heel slides 10 reps SAQ's 10 reps ABD Followed by ICE     General Comments        Pertinent Vitals/Pain Pain Assessment: 0-10 Pain Score: 3  Pain Location: Jon hip Pain Descriptors / Indicators: Aching;Tightness Pain Intervention(s): Monitored during session;Premedicated before session;Repositioned;Ice applied    Home Living                      Prior Function            PT Goals (current goals can now be found in the care plan section) Progress towards PT goals: Progressing toward goals    Frequency  7X/week    PT Plan      Co-evaluation             End of Session Equipment Utilized During Treatment: Gait belt Activity Tolerance: Patient tolerated treatment well Patient left: in chair;with call bell/phone within reach     Time: 0830-0855 PT Time Calculation (min) (ACUTE ONLY): 25 min  Charges:  $Gait Training: 8-22 mins $Therapeutic Exercise: 8-22 mins                    G Codes:      Jon ShellingLori Rikayla Rice  PTA [facility]  Acute  Rehab Pager  [phone]  

## 2014-06-16 NOTE — Progress Notes (Signed)
Pt. Left via wheelchair with family. Has new large walker. No respiratory distress noted. Discharged to home.

## 2014-06-16 NOTE — Discharge Summary (Signed)
Patient ID: Jon Rice MRN: [id number] DOB/AGE: 53/05/1960 53 y.o.  Admit date: 06/14/2014 Discharge date: 06/16/2014  Admission Diagnoses:  Principal Problem:   Osteoarthritis of left hip Active Problems:   Status post total replacement of left hip   Discharge Diagnoses:  Same  Past Medical History  Diagnosis Date  . Sleep apnea     cpap- does not know settings   . Arthritis     Surgeries: Procedure(s): LEFT TOTAL HIP ARTHROPLASTY ANTERIOR APPROACH on 06/14/2014   Consultants:    Discharged Condition: Improved  Hospital Course: Jon Rice is an 54 y.o. male who was admitted 06/14/2014 for operative treatment ofOsteoarthritis of left hip. Patient has severe unremitting pain that affects sleep, daily activities, and work/hobbies. After pre-op clearance the patient was taken to the operating room on 06/14/2014 and underwent  Procedure(s): LEFT TOTAL HIP ARTHROPLASTY ANTERIOR APPROACH.    Patient was given perioperative antibiotics: Anti-infectives    Start     Dose/Rate Route Frequency Ordered Stop   06/14/14 1400  ceFAZolin (ANCEF) IVPB 2 g/50 mL premix     2 g100 mL/hr over 30 Minutes Intravenous Every 6 hours 06/14/14 1111 06/14/14 2128   06/14/14 0506  ceFAZolin (ANCEF) 3 g in dextrose 5 % 50 mL IVPB     3 g160 mL/hr over 30 Minutes Intravenous On call to O.R. 06/14/14 [id number] 06/14/14 0727       Patient was given sequential compression devices, early ambulation, and chemoprophylaxis to prevent DVT.  Patient benefited maximally from hospital stay and there were no complications.    Recent vital signs: Patient Vitals for the past 24 hrs:  BP Temp Temp src Pulse Resp SpO2  06/16/14 0800 - - - - 16 -  06/16/14 0547 123/60 mmHg 98.3 F (36.8 C) Oral 89 18 95 %  06/16/14 0400 - - - - 16 94 %  06/16/14 0000 - - - - 16 92 %  06/15/14 2100 123/60 mmHg 98.3 F (36.8 C) Oral 87 19 96 %  06/15/14 2000 - - - - 18 94 %  06/15/14 1600 - - - - 20 95 %  06/15/14 1445  112/66 mmHg 97.7 F (36.5 C) Oral 84 16 100 %  06/15/14 1200 - - - - 16 -  06/15/14 1014 110/70 mmHg 98.2 F (36.8 C) Oral 74 16 97 %     Recent laboratory studies:  Recent Labs  06/15/14 0441 06/16/14 0455  WBC 17.4* 13.3*  HGB 14.2 12.5*  HCT 42.5 37.7*  PLT 259 249  NA 139  --   K 4.1  --   CL 96  --   CO2 35*  --   BUN 11  --   CREATININE 0.79  --   GLUCOSE 151*  --   CALCIUM 9.1  --      Discharge Medications:     Medication List    STOP taking these medications        diclofenac 50 MG EC tablet  Commonly known as:  VOLTAREN     traMADol 100 MG 24 hr tablet  Commonly known as:  ULTRAM-ER      TAKE these medications        aspirin 325 MG EC tablet  Take 1 tablet (325 mg total) by mouth 2 (two) times daily after a meal.     atenolol-chlorthalidone 100-25 MG per tablet  Commonly known as:  TENORETIC  Take 1 tablet by mouth at bedtime.  gabapentin 300 MG capsule  Commonly known as:  NEURONTIN  Take 300 mg by mouth 2 (two) times daily.     methocarbamol 500 MG tablet  Commonly known as:  ROBAXIN  Take 1 tablet (500 mg total) by mouth every 6 (six) hours as needed for muscle spasms.     oxyCODONE-acetaminophen 5-325 MG per tablet  Commonly known as:  ROXICET  Take 1-2 tablets by mouth every 4 (four) hours as needed.     psyllium 95 % Pack  Commonly known as:  HYDROCIL/METAMUCIL  Take 1 packet by mouth daily.     ranitidine 75 MG tablet  Commonly known as:  ZANTAC  Take 75 mg by mouth daily as needed for heartburn.        Diagnostic Studies: Dg Chest 2 View  06/06/2014   CLINICAL DATA:  Hip replacement.  Hypertension .  EXAM: CHEST  2 VIEW  COMPARISON:  None.  FINDINGS: The heart size and mediastinal contours are within normal limits. Both lungs are clear. The visualized skeletal structures are unremarkable.  IMPRESSION: No active cardiopulmonary disease.   Electronically Signed   By: Maisie Fus  Register   On: 06/06/2014 11:36   Dg Pelvis  Portable  06/14/2014   CLINICAL DATA:  Postop for left anterior approach hip arthroplasty.  EXAM: PORTABLE PELVIS 1-2 VIEWS  COMPARISON:  At 01/25/2014 any intraoperative study of earlier today.  FINDINGS: AP view of pelvis demonstrates a new left hip arthroplasty, without acute hardware complication. remote right hip arthroplasty. No periprosthetic fracture.  IMPRESSION: Expected appearance after left hip arthroplasty.   Electronically Signed   By: Jeronimo Greaves M.D.   On: 06/14/2014 10:35   Dg C-arm 61-120 Min-no Report  06/14/2014   CLINICAL DATA:  Left anterior hip replacement. C-arm time 38 seconds.  EXAM: DG HIP W/ PELVIS 1V*L*; DG C-ARM 61-120 MIN - NRPT [facility]  COMPARISON:  None.  FINDINGS: Saved images demonstrate a left total hip arthroplasty with components appearing well aligned. No fracture or other immediate complication is evident.  IMPRESSION: Left total hip arthroplasty   Electronically Signed   By: Ellery Plunk M.D.   On: 06/14/2014 09:29   Dg Hip Unilat With Pelvis 1v Left  06/14/2014   CLINICAL DATA:  Left anterior hip replacement. C-arm time 38 seconds.  EXAM: DG HIP W/ PELVIS 1V*L*; DG C-ARM 61-120 MIN - NRPT [facility]  COMPARISON:  None.  FINDINGS: Saved images demonstrate a left total hip arthroplasty with components appearing well aligned. No fracture or other immediate complication is evident.  IMPRESSION: Left total hip arthroplasty   Electronically Signed   By: Ellery Plunk M.D.   On: 06/14/2014 09:29    Disposition: to home       Discharge Instructions    Call MD / Call 911    Complete by:  As directed   If you experience chest pain or shortness of breath, CALL 911 and be transported to the hospital emergency room.  If you develope a fever above 101 F, pus (white drainage) or increased drainage or redness at the wound, or calf pain, call your surgeon's office.     Constipation Prevention    Complete by:  As directed   Drink plenty of fluids.  Prune juice may be  helpful.  You may use a stool softener, such as Colace (over the counter) 100 mg twice a day.  Use MiraLax (over the counter) for constipation as needed.     Diet - low sodium  heart healthy    Complete by:  As directed      Discharge instructions    Complete by:  As directed   Increase your activities as comfort allows. Ice as needed for swelling. You can get your hip dressing wet daily in the shower.     Discharge patient    Complete by:  As directed      Increase activity slowly as tolerated    Complete by:  As directed            Follow-up Information    Follow up with Kathryne Hitch, MD In 2 weeks.   Specialty:  Orthopedic Surgery   Contact information:   [address] [place] [address] [phone]        Signed: Kathryne Hitch 06/16/2014, 8:08 AM

## 2014-06-16 NOTE — Progress Notes (Signed)
Discharge teaching completed with teach back. Pt. Understands to call Monday for follow up appt. In 2 weeks with Dr. Rayburn MaBlackmon. Discharge instructions given and reviewed with pt. Prescriptions given for Asprin, Oxycodone, Robaxin. Answered all questions.  Pt. Awaiting walker and ride home.

## 2014-06-16 NOTE — Progress Notes (Signed)
06/16/2014 1000 NCM spoke to pt and requested RW for home. Contacted [facility] for RW for home. Isidoro DonningAlesia Yazeed Pryer RN CCM Case Mgmt phone [phone]

## 2014-06-17 ENCOUNTER — Encounter (HOSPITAL_COMMUNITY): Payer: Self-pay | Admitting: Orthopaedic Surgery

## 2014-08-23 ENCOUNTER — Inpatient Hospital Stay (HOSPITAL_COMMUNITY)
Admission: AD | Admit: 2014-08-23 | Discharge: 2014-08-27 | DRG: 603 | Disposition: A | Discharge status: Home / Self Care | Payer: Managed Care, Other (non HMO) | Source: Ambulatory Visit | Attending: Orthopaedic Surgery | Admitting: Orthopaedic Surgery

## 2014-08-23 ENCOUNTER — Inpatient Hospital Stay (HOSPITAL_COMMUNITY): Payer: Managed Care, Other (non HMO)

## 2014-08-23 DIAGNOSIS — Z87891 Personal history of nicotine dependence: Secondary | ICD-10-CM | POA: Diagnosis not present

## 2014-08-23 DIAGNOSIS — Z7982 Long term (current) use of aspirin: Secondary | ICD-10-CM

## 2014-08-23 DIAGNOSIS — L03116 Cellulitis of left lower limb: Secondary | ICD-10-CM | POA: Diagnosis present

## 2014-08-23 DIAGNOSIS — M1990 Unspecified osteoarthritis, unspecified site: Secondary | ICD-10-CM | POA: Diagnosis present

## 2014-08-23 DIAGNOSIS — Z79899 Other long term (current) drug therapy: Secondary | ICD-10-CM

## 2014-08-23 DIAGNOSIS — Z96649 Presence of unspecified artificial hip joint: Secondary | ICD-10-CM

## 2014-08-23 DIAGNOSIS — L0390 Cellulitis, unspecified: Secondary | ICD-10-CM

## 2014-08-23 DIAGNOSIS — Z96643 Presence of artificial hip joint, bilateral: Secondary | ICD-10-CM | POA: Diagnosis present

## 2014-08-23 DIAGNOSIS — G4730 Sleep apnea, unspecified: Secondary | ICD-10-CM | POA: Diagnosis present

## 2014-08-23 HISTORY — DX: Cellulitis, unspecified: L03.90

## 2014-08-23 LAB — CBC WITH DIFFERENTIAL/PLATELET
BASOS ABS: 0 10*3/uL (ref 0.0–0.1)
BASOS PCT: 0 % (ref 0–1)
EOS ABS: 0.2 10*3/uL (ref 0.0–0.7)
EOS PCT: 2 % (ref 0–5)
HEMATOCRIT: 45.7 % (ref 39.0–52.0)
HEMOGLOBIN: 15 g/dL (ref 13.0–17.0)
Lymphocytes Relative: 19 % (ref 12–46)
Lymphs Abs: 1.8 10*3/uL (ref 0.7–4.0)
MCH: 30.1 pg (ref 26.0–34.0)
MCHC: 32.8 g/dL (ref 30.0–36.0)
MCV: 91.6 fL (ref 78.0–100.0)
MONO ABS: 0.4 10*3/uL (ref 0.1–1.0)
MONOS PCT: 4 % (ref 3–12)
Neutro Abs: 7.5 10*3/uL (ref 1.7–7.7)
Neutrophils Relative %: 75 % (ref 43–77)
Platelets: 208 10*3/uL (ref 150–400)
RBC: 4.99 MIL/uL (ref 4.22–5.81)
RDW: 14.4 % (ref 11.5–15.5)
WBC: 10 10*3/uL (ref 4.0–10.5)

## 2014-08-23 LAB — COMPREHENSIVE METABOLIC PANEL
ALK PHOS: 69 U/L (ref 39–117)
ALT: 19 U/L (ref 0–53)
ANION GAP: 11 (ref 5–15)
AST: 21 U/L (ref 0–37)
Albumin: 3.5 g/dL (ref 3.5–5.2)
BUN: 9 mg/dL (ref 6–23)
CO2: 30 mmol/L (ref 19–32)
Calcium: 9.2 mg/dL (ref 8.4–10.5)
Chloride: 94 mmol/L — ABNORMAL LOW (ref 96–112)
Creatinine, Ser: 0.94 mg/dL (ref 0.50–1.35)
GFR calc non Af Amer: >90 mL/min (ref >90)
GLUCOSE: 162 mg/dL — AB (ref 70–99)
Potassium: 2.7 mmol/L — CL (ref 3.5–5.1)
Sodium: 135 mmol/L (ref 135–145)
Total Bilirubin: 1.1 mg/dL (ref 0.3–1.2)
Total Protein: 6.9 g/dL (ref 6.0–8.3)

## 2014-08-23 LAB — SEDIMENTATION RATE: Sed Rate: 15 mm/hr (ref 0–16)

## 2014-08-23 MED ORDER — HYDROCODONE-ACETAMINOPHEN 5-325 MG PO TABS: 1.0000 | ORAL_TABLET | ORAL | Status: DC | PRN

## 2014-08-23 MED ORDER — ONDANSETRON HCL 4 MG/2ML IJ SOLN: 4.0000 mg | Freq: Four times a day (QID) | INTRAMUSCULAR | Status: DC | PRN

## 2014-08-23 MED ORDER — ATENOLOL-CHLORTHALIDONE 100-25 MG PO TABS: 1.0000 | ORAL_TABLET | Freq: Every day | ORAL | Status: DC

## 2014-08-23 MED ORDER — ONDANSETRON HCL 4 MG PO TABS: 4.0000 mg | ORAL_TABLET | Freq: Four times a day (QID) | ORAL | Status: DC | PRN

## 2014-08-23 MED ORDER — METHOCARBAMOL 500 MG PO TABS: 500.0000 mg | ORAL_TABLET | Freq: Four times a day (QID) | ORAL | Status: DC | PRN

## 2014-08-23 MED ORDER — CHLORTHALIDONE 25 MG PO TABS
25.0000 mg | ORAL_TABLET | Freq: Every day | ORAL | Status: DC
  Administered 2014-08-23 – 2014-08-26 (×4): 25 mg via ORAL
  Filled 2014-08-23 (×7): qty 1

## 2014-08-23 MED ORDER — GABAPENTIN 300 MG PO CAPS
300.0000 mg | ORAL_CAPSULE | Freq: Two times a day (BID) | ORAL | Status: DC
  Administered 2014-08-23 – 2014-08-26 (×7): 300 mg via ORAL
  Filled 2014-08-23 (×7): qty 1
  Filled 2014-08-23: qty 3

## 2014-08-23 MED ORDER — POTASSIUM CHLORIDE 20 MEQ PO PACK
40.0000 meq | PACK | Freq: Two times a day (BID) | ORAL | Status: DC
  Filled 2014-08-23: qty 2

## 2014-08-23 MED ORDER — OXYCODONE-ACETAMINOPHEN 5-325 MG PO TABS
1.0000 | ORAL_TABLET | ORAL | Status: DC | PRN
  Administered 2014-08-27: 2 via ORAL
  Filled 2014-08-23: qty 2

## 2014-08-23 MED ORDER — MORPHINE SULFATE 2 MG/ML IJ SOLN: 1.0000 mg | INTRAMUSCULAR | Status: DC | PRN

## 2014-08-23 MED ORDER — ACETAMINOPHEN 650 MG RE SUPP: 650.0000 mg | Freq: Four times a day (QID) | RECTAL | Status: DC | PRN

## 2014-08-23 MED ORDER — PSYLLIUM 95 % PO PACK
1.0000 | PACK | Freq: Every day | ORAL | Status: DC
  Administered 2014-08-24 – 2014-08-26 (×3): 1 via ORAL
  Filled 2014-08-23 (×5): qty 1

## 2014-08-23 MED ORDER — ACETAMINOPHEN 325 MG PO TABS: 650.0000 mg | ORAL_TABLET | Freq: Four times a day (QID) | ORAL | Status: DC | PRN

## 2014-08-23 MED ORDER — ATENOLOL 50 MG PO TABS
100.0000 mg | ORAL_TABLET | Freq: Every day | ORAL | Status: DC
  Administered 2014-08-23 – 2014-08-26 (×4): 100 mg via ORAL
  Filled 2014-08-23 (×4): qty 2

## 2014-08-23 MED ORDER — ALUM & MAG HYDROXIDE-SIMETH 200-200-20 MG/5ML PO SUSP: 30.0000 mL | Freq: Four times a day (QID) | ORAL | Status: DC | PRN

## 2014-08-23 MED ORDER — POTASSIUM CHLORIDE 20 MEQ/15ML (10%) PO SOLN
40.0000 meq | Freq: Two times a day (BID) | ORAL | Status: AC
  Administered 2014-08-24 (×3): 40 meq via ORAL
  Filled 2014-08-23 (×3): qty 30

## 2014-08-23 NOTE — Progress Notes (Signed)
Spoke with Dr. Roda ShuttersXu, who said he will put orders in for patient after he gets home.

## 2014-08-23 NOTE — H&P (Signed)
Jon Rice is an 54 y.o. male.   Chief Complaint:   Left hip pain and redness HPI:   54 yo morbidly obese male who is about 10 weeks post a left total hip arthroplasty thru a direct anterior approach.  His post-operative course had been uneventful and he had been doing well until the past 24-48 hours when he developed redness and pain just posterior and superior to his left hip incision. Denies any recent illness and had no previous problems in post-op follow-up.  He does report fever and chills.  Past Medical History  Diagnosis Date  . Sleep apnea     cpap- does not know settings   . Arthritis     Past Surgical History  Procedure Laterality Date  . Joint replacement      right hip replacement   . Right hand surgery     . Right wrist surgery       as a child   . Carpal tunnel release      right   . Total hip arthroplasty Left 06/14/2014    Procedure: LEFT TOTAL HIP ARTHROPLASTY ANTERIOR APPROACH;  Surgeon: Mcarthur Rossetti, MD;  Location: [facility];  Service: Orthopedics;  Laterality: Left;    No family history on file. Social History:  reports that he has never smoked. He has quit using smokeless tobacco. His smokeless tobacco use included Snuff. He reports that he drinks alcohol. He reports that he does not use illicit drugs.  Allergies: No Known Allergies  Medications Prior to Admission  Medication Sig Dispense Refill  . aspirin EC 325 MG EC tablet Take 1 tablet (325 mg total) by mouth 2 (two) times daily after a meal. 30 tablet 0  . atenolol-chlorthalidone (TENORETIC) 100-25 MG per tablet Take 1 tablet by mouth at bedtime.    . gabapentin (NEURONTIN) 300 MG capsule Take 300 mg by mouth 2 (two) times daily.    . methocarbamol (ROBAXIN) 500 MG tablet Take 1 tablet (500 mg total) by mouth every 6 (six) hours as needed for muscle spasms. 60 tablet 0  . oxyCODONE-acetaminophen (ROXICET) 5-325 MG per tablet Take 1-2 tablets by mouth every 4 (four) hours as needed. 60 tablet 0    . psyllium (HYDROCIL/METAMUCIL) 95 % PACK Take 1 packet by mouth daily.    . ranitidine (ZANTAC) 75 MG tablet Take 75 mg by mouth daily as needed for heartburn.      Results for orders placed or performed during the hospital encounter of 08/23/14 (from the past 48 hour(s))  Comprehensive metabolic panel     Status: Abnormal   Collection Time: 08/23/14  8:47 PM  Result Value Ref Range   Sodium 135 135 - 145 mmol/L   Potassium 2.7 (LL) 3.5 - 5.1 mmol/L    Comment: REPEATED TO VERIFY CRITICAL RESULT CALLED TO, READ BACK BY AND VERIFIED WITH: C.OSBORNE,RN 2132 08/23/14 M.CAMPBELL    Chloride 94 (L) 96 - 112 mmol/L   CO2 30 19 - 32 mmol/L   Glucose, Bld 162 (H) 70 - 99 mg/dL   BUN 9 6 - 23 mg/dL   Creatinine, Ser 0.94 0.50 - 1.35 mg/dL   Calcium 9.2 8.4 - 10.5 mg/dL   Total Protein 6.9 6.0 - 8.3 g/dL   Albumin 3.5 3.5 - 5.2 g/dL   AST 21 0 - 37 U/L   ALT 19 0 - 53 U/L   Alkaline Phosphatase 69 39 - 117 U/L   Total Bilirubin 1.1 0.3 - 1.2  mg/dL   GFR calc non Af Amer >90 >90 mL/min   GFR calc Af Amer >90 >90 mL/min    Comment: (NOTE) The eGFR has been calculated using the CKD EPI equation. This calculation has not been validated in all clinical situations. eGFR's persistently <90 mL/min signify possible Chronic Kidney Disease.    Anion gap 11 5 - 15  CBC WITH DIFFERENTIAL     Status: None   Collection Time: 08/23/14  8:47 PM  Result Value Ref Range   WBC 10.0 4.0 - 10.5 K/uL   RBC 4.99 4.22 - 5.81 MIL/uL   Hemoglobin 15.0 13.0 - 17.0 g/dL   HCT 45.7 39.0 - 52.0 %   MCV 91.6 78.0 - 100.0 fL   MCH 30.1 26.0 - 34.0 pg   MCHC 32.8 30.0 - 36.0 g/dL   RDW 14.4 11.5 - 15.5 %   Platelets 208 150 - 400 K/uL   Neutrophils Relative % 75 43 - 77 %   Neutro Abs 7.5 1.7 - 7.7 K/uL   Lymphocytes Relative 19 12 - 46 %   Lymphs Abs 1.8 0.7 - 4.0 K/uL   Monocytes Relative 4 3 - 12 %   Monocytes Absolute 0.4 0.1 - 1.0 K/uL   Eosinophils Relative 2 0 - 5 %   Eosinophils Absolute 0.2 0.0  - 0.7 K/uL   Basophils Relative 0 0 - 1 %   Basophils Absolute 0.0 0.0 - 0.1 K/uL   Dg Pelvis Portable  08/23/2014   CLINICAL DATA:  Bilateral hip pain, left ear replacement 2 months ago  EXAM: PORTABLE PELVIS 1-2 VIEWS  COMPARISON:  06/14/2014  FINDINGS: Left hip arthroplasty in satisfactory position.  Right hip arthroplasty in satisfactory position.  No fracture or dislocation is seen.  IMPRESSION: Bilateral hip arthroplasties, without evidence of complication.   Electronically Signed   By: Julian Hy M.D.   On: 08/23/2014 21:06    Review of Systems  Constitutional: Positive for fever and chills.  All other systems reviewed and are negative.   Blood pressure 144/80, pulse 94, temperature 98.2 F (36.8 C), resp. rate 18, SpO2 94 %. Physical Exam  Constitutional: He appears well-developed and well-nourished.  HENT:  Head: Normocephalic and atraumatic.  Eyes: EOM are normal. Pupils are equal, round, and reactive to light.  Neck: Normal range of motion. Neck supple.  Cardiovascular: Normal rate and regular rhythm.   Respiratory: Effort normal and breath sounds normal.  GI: Soft. Bowel sounds are normal.  Musculoskeletal:       Legs:    Assessment/Plan Left hip cellulitis 10 weeks post a left direct anterior hip replacement 1)  Admission for IV antibiotics, labs, and CT scan to further evaluate his left hip to assess the need for a surgical intervention  Nekeshia Lenhardt Y 08/23/2014, 10:04 PM

## 2014-08-23 NOTE — Progress Notes (Signed)
Dr. Roda ShuttersXu paged to notify that patient has no orders.

## 2014-08-24 ENCOUNTER — Inpatient Hospital Stay (HOSPITAL_COMMUNITY): Payer: Managed Care, Other (non HMO)

## 2014-08-24 ENCOUNTER — Encounter (HOSPITAL_COMMUNITY): Payer: Self-pay | Admitting: Radiology

## 2014-08-24 LAB — C-REACTIVE PROTEIN: CRP: 20.8 mg/dL — AB (ref <0.60)

## 2014-08-24 MED ORDER — PIPERACILLIN-TAZOBACTAM 3.375 G IVPB 30 MIN
3.3750 g | Freq: Once | INTRAVENOUS | Status: AC
  Administered 2014-08-24: 3.375 g via INTRAVENOUS
  Filled 2014-08-24: qty 50

## 2014-08-24 MED ORDER — VANCOMYCIN HCL 10 G IV SOLR
2500.0000 mg | INTRAVENOUS | Status: AC
  Administered 2014-08-24: 2500 mg via INTRAVENOUS
  Filled 2014-08-24 (×2): qty 2500

## 2014-08-24 MED ORDER — SODIUM CHLORIDE 0.9 % IV SOLN
INTRAVENOUS | Status: DC
  Administered 2014-08-24 – 2014-08-26 (×2): via INTRAVENOUS

## 2014-08-24 MED ORDER — VANCOMYCIN HCL 10 G IV SOLR
1500.0000 mg | Freq: Three times a day (TID) | INTRAVENOUS | Status: DC
  Administered 2014-08-24 – 2014-08-27 (×9): 1500 mg via INTRAVENOUS
  Filled 2014-08-24 (×10): qty 1500

## 2014-08-24 MED ORDER — VANCOMYCIN HCL IN DEXTROSE 1-5 GM/200ML-% IV SOLN
1000.0000 mg | Freq: Once | INTRAVENOUS | Status: DC
  Filled 2014-08-24: qty 200

## 2014-08-24 MED ORDER — PIPERACILLIN-TAZOBACTAM 3.375 G IVPB
3.3750 g | Freq: Three times a day (TID) | INTRAVENOUS | Status: DC
  Administered 2014-08-24 – 2014-08-27 (×9): 3.375 g via INTRAVENOUS
  Filled 2014-08-24 (×10): qty 50

## 2014-08-24 MED ORDER — IOHEXOL 300 MG/ML  SOLN
100.0000 mL | Freq: Once | INTRAMUSCULAR | Status: AC | PRN
  Administered 2014-08-24: 100 mL via INTRAVENOUS

## 2014-08-24 NOTE — Progress Notes (Signed)
Patient is stable this morning.  Had CT last night, awaiting final read from radiology.  Continue NPO.  Hold abx for now as patient is non septic.  Mayra ReelN. Michael Xu, MD [facility] [phone] 8:04 AM

## 2014-08-24 NOTE — Progress Notes (Signed)
Patient ID: Jon MassyRobert B Rice, male   DOB: 06/26/1960, 54 y.o.   MRN: [id number] I assessed [name]' left hip and reviewed his labs and CT scan.  He does appear to have quite a significant cellulitis in the left hip area and it is painful.  Will start broad spectrum antibiotics today.  Do not see an indication yet for surgery.

## 2014-08-24 NOTE — Progress Notes (Signed)
ANTIBIOTIC CONSULT NOTE - INITIAL  Pharmacy Consult for Vanco/Zosyn Indication: Cellulitis  No Known Allergies  Patient Measurements: 173.3 kg back in Jan 2016   Adjusted Body Weight:    Vital Signs: Temp: 99.2 F (37.3 C) (04/02 0436) BP: 113/66 mmHg (04/02 0436) Pulse Rate: 86 (04/02 0436) Intake/Output from previous day: 04/01 0701 - 04/02 0700 In: 240 [P.O.:240] Out: -  Intake/Output from this shift:    Labs:  Recent Labs  08/23/14 2047  WBC 10.0  HGB 15.0  PLT 208  CREATININE 0.94   CrCl cannot be calculated (Unknown ideal weight.). No results for input(s): VANCOTROUGH, VANCOPEAK, VANCORANDOM, GENTTROUGH, GENTPEAK, GENTRANDOM, TOBRATROUGH, TOBRAPEAK, TOBRARND, AMIKACINPEAK, AMIKACINTROU, AMIKACIN in the last 72 hours.   Microbiology: No results found for this or any previous visit (from the past 720 hour(s)).  Medical History: Past Medical History  Diagnosis Date  . Sleep apnea     cpap- does not know settings   . Arthritis     Medications:  Prescriptions prior to admission  Medication Sig Dispense Refill Last Dose  . aspirin EC 325 MG EC tablet Take 1 tablet (325 mg total) by mouth 2 (two) times daily after a meal. 30 tablet 0 08/23/2014 at Unknown time  . atenolol-chlorthalidone (TENORETIC) 100-25 MG per tablet Take 1 tablet by mouth at bedtime.   08/23/2014 at 1900  . ibuprofen (ADVIL,MOTRIN) 200 MG tablet Take 200-1,800 mg by mouth every 6 (six) hours as needed for mild pain or moderate pain.   08/22/2014  . mupirocin ointment (BACTROBAN) 2 % Apply 1 application topically every morning.   08/23/2014 at Unknown time  . psyllium (HYDROCIL/METAMUCIL) 95 % PACK Take 1 packet by mouth daily.   Past Week at Unknown time  . ranitidine (ZANTAC) 75 MG tablet Take 75 mg by mouth daily as needed for heartburn.   Past Week at Unknown time   Assessment:53 yoM presents with cellulitis in L hip. Of note, he is ~10 weeks post left total hip  arthroplasty.  Anticoagulation: Text Paged MD to consider adding Lovenox for DVT ppx  Infectious Disease: Tmax 99.2, CRP 20.8, ESR 15, WBC 10. No cultures.   4/2 Zosyn>> 4/2 Vanc>>  Cardiovascular: BP variable, HR stable. On atenolol, chlorthalidone Endocrinology: Glucose 162 Gastrointestinal / Nutrition: LFTs WNL Neurology: gabapentin Nephrology: K low at 2.7 - KCl 40 mEq X 3, all other lytes WNL. SCr 0.94 Pulmonary: RA Hematology / Oncology: Hgb 15, plts 208 PTA Medication Issues Best Practices: SCDs  Goal of Therapy:  Vancomycin trough level 10-15 mcg/ml  Plan:  1. Text Paged MD to consider adding Lovenox for DVT ppx 2. Need current height/weight 3.Vancomycin 2512m load then 15015mIV q8hr. Vancomycin trough after 3-5 doses at steady state. 4. Zosyn 3.375g IV q8hr.   Alaiyah Bollman S. RoAlford HighlandPharmD, BCPS Clinical Staff Pharmacist Pager [phone] Ghazitillinger 08/24/2014,10:27 AM

## 2014-08-25 LAB — CBC
HCT: 41.8 % (ref 39.0–52.0)
Hemoglobin: 13.9 g/dL (ref 13.0–17.0)
MCH: 30.5 pg (ref 26.0–34.0)
MCHC: 33.3 g/dL (ref 30.0–36.0)
MCV: 91.7 fL (ref 78.0–100.0)
PLATELETS: 269 10*3/uL (ref 150–400)
RBC: 4.56 MIL/uL (ref 4.22–5.81)
RDW: 14.4 % (ref 11.5–15.5)
WBC: 8 10*3/uL (ref 4.0–10.5)

## 2014-08-25 NOTE — Progress Notes (Signed)
CBC pending.  Cellulitis is marginally improved but is less tender to touch, certainly no worse.  Continue broad spectrum abx.  Cellulitis bordered marked with skin marker.  Continue to follow for clinical improvement.  Jon ReelN. Michael Xu, MD [facility] [phone] 8:41 AM

## 2014-08-26 ENCOUNTER — Encounter (HOSPITAL_COMMUNITY): Payer: Self-pay | Admitting: General Practice

## 2014-08-26 NOTE — Progress Notes (Signed)
Patient ID: Jon MassyRobert B Rice, male   DOB: 09/11/1960, 54 y.o.   MRN: [id number] Looks significantly better overall today.  Still needs one more day of IV antibiotics prior to discharge to home tomorrow on oral antibiotics.

## 2014-08-27 LAB — BASIC METABOLIC PANEL
ANION GAP: 6 (ref 5–15)
BUN: <5 mg/dL — ABNORMAL LOW (ref 6–23)
CALCIUM: 9.1 mg/dL (ref 8.4–10.5)
CO2: 36 mmol/L — AB (ref 19–32)
Chloride: 98 mmol/L (ref 96–112)
Creatinine, Ser: 0.9 mg/dL (ref 0.50–1.35)
GFR calc Af Amer: >90 mL/min (ref >90)
Glucose, Bld: 112 mg/dL — ABNORMAL HIGH (ref 70–99)
POTASSIUM: 3.3 mmol/L — AB (ref 3.5–5.1)
SODIUM: 140 mmol/L (ref 135–145)

## 2014-08-27 MED ORDER — CLINDAMYCIN HCL 300 MG PO CAPS: 300.0000 mg | ORAL_CAPSULE | Freq: Three times a day (TID) | ORAL | Status: AC

## 2014-08-27 MED ORDER — CIPROFLOXACIN HCL 500 MG PO TABS: 500.0000 mg | ORAL_TABLET | Freq: Two times a day (BID) | ORAL | Status: AC

## 2014-08-27 NOTE — Progress Notes (Signed)
Patient ID: Arnetha MassyRobert B Sisemore, male   DOB: 01/31/1961, 54 y.o.   MRN: [id number] Reports much less pain.  Cellulitis almost resolved.  Afebrile with stable vitals.  Can discharge to home today on oral antibiotics.  Has follow-up in 6 days.

## 2014-08-27 NOTE — Discharge Instructions (Signed)
Try ice pack to left hip area for 20 minutes twice daily

## 2014-08-27 NOTE — Discharge Summary (Signed)
Patient ID: Jon Rice MRN: [id number] DOB/AGE: 53/05/1960 53 y.o.  Admit date: 08/23/2014 Discharge date: 08/27/2014  Admission Diagnoses:  Principal Problem:   Cellulitis of left hip Active Problems:   Hip joint replacement status   Discharge Diagnoses:  Same  Past Medical History  Diagnosis Date  . Sleep apnea     cpap- does not know settings   . Arthritis   . Cellulitis 08/2014    left hip    Surgeries:  on * No surgery found *   Consultants:    Discharged Condition: Improved  Hospital Course: Jon Rice is an 54 y.o. male who was admitted 08/23/2014 for operative treatment ofCellulitis of left hip. Patient has severe unremitting pain that affects sleep, daily activities, and work/hobbies. After pre-op clearance the patient was taken to the operating room on * No surgery found * and underwent  .    Patient was given perioperative antibiotics: Anti-infectives    Start     Dose/Rate Route Frequency Ordered Stop   08/27/14 0000  clindamycin (CLEOCIN) 300 MG capsule     300 mg Oral 3 times daily 08/27/14 0723     08/27/14 0000  ciprofloxacin (CIPRO) 500 MG tablet     500 mg Oral 2 times daily 08/27/14 0723     08/24/14 1900  vancomycin (VANCOCIN) 1,500 mg in sodium chloride 0.9 % 500 mL IVPB     1,500 mg 250 mL/hr over 120 Minutes Intravenous Every 8 hours 08/24/14 1031     08/24/14 1900  piperacillin-tazobactam (ZOSYN) IVPB 3.375 g     3.375 g 12.5 mL/hr over 240 Minutes Intravenous Every 8 hours 08/24/14 1518     08/24/14 1045  vancomycin (VANCOCIN) 2,500 mg in sodium chloride 0.9 % 500 mL IVPB     2,500 mg 250 mL/hr over 120 Minutes Intravenous NOW 08/24/14 1031 08/24/14 1436   08/24/14 0930  piperacillin-tazobactam (ZOSYN) IVPB 3.375 g     3.375 g 100 mL/hr over 30 Minutes Intravenous  Once 08/24/14 0859 08/24/14 1122   08/24/14 0930  vancomycin (VANCOCIN) IVPB 1000 mg/200 mL premix  Status:  Discontinued     1,000 mg 200 mL/hr over 60 Minutes  Intravenous  Once 08/24/14 0859 08/24/14 1030       Patient benefited maximally from hospital stay and there were no complications.   His cellulitis almost completely resolved prior to discharge.  Recent vital signs: Patient Vitals for the past 24 hrs:  BP Temp Temp src Pulse Resp SpO2  08/26/14 1944 137/90 mmHg 97.8 F (36.6 C) Oral 88 - 97 %  08/26/14 1342 130/65 mmHg 97.5 F (36.4 C) Oral 76 16 96 %     Recent laboratory studies:  Recent Labs  08/25/14 0755  WBC 8.0  HGB 13.9  HCT 41.8  PLT 269     Discharge Medications:     Medication List    STOP taking these medications        aspirin 325 MG EC tablet      TAKE these medications        atenolol-chlorthalidone 100-25 MG per tablet  Commonly known as:  TENORETIC  Take 1 tablet by mouth at bedtime.     ciprofloxacin 500 MG tablet  Commonly known as:  CIPRO  Take 1 tablet (500 mg total) by mouth 2 (two) times daily.     clindamycin 300 MG capsule  Commonly known as:  CLEOCIN  Take 1 capsule (300 mg total) by mouth 3 (  three) times daily.     ibuprofen 200 MG tablet  Commonly known as:  ADVIL,MOTRIN  Take 200-1,800 mg by mouth every 6 (six) hours as needed for mild pain or moderate pain.     mupirocin ointment 2 %  Commonly known as:  BACTROBAN  Apply 1 application topically every morning.     psyllium 95 % Pack  Commonly known as:  HYDROCIL/METAMUCIL  Take 1 packet by mouth daily.     ranitidine 75 MG tablet  Commonly known as:  ZANTAC  Take 75 mg by mouth daily as needed for heartburn.        Diagnostic Studies: Ct Hip Left W Contrast  2014/09/08   CLINICAL DATA:  Pain and erythema posterior and superior to the left hip incision following anterior approach left hip arthroplasty 10 weeks ago. On antibiotics for cellulitis. CT requested to assess for need for surgical intervention. Initial encounter.  EXAM: CT OF THE LEFT HIP WITH CONTRAST  TECHNIQUE: Multidetector CT imaging was performed following  the standard protocol during bolus administration of intravenous contrast.  CONTRAST:  OMNIPAQUE IOHEXOL 300 MG/ML  SOLN  COMPARISON:  Radiographs same date and 06/14/2014.  FINDINGS: Examination concentrates on the left hip. Portions of the right hip are included. The recent left total hip arthroplasty has a stable appearance without displacement, loosening, fracture or dislocation. Previous right femoral head resurfacing procedure appears grossly stable.  There is soft tissue stranding within the subcutaneous fat anterolateral to the left hip, presumably at the incision. Air insinuating into the subcutaneous tissues appears to be within a crease under the patient's abdominal pannus. No definite soft tissue emphysema or focal fluid collection identified. Allowing for the beam hardening artifact associated with the hardware, no deep fluid collection or large hip joint effusion identified.  There are probable reactive lymph nodes in both inguinal regions. Atrophy of the right iliopsoas and adductor musculature noted.  IMPRESSION: 1. Soft tissue stranding in the subcutaneous fat deep to the incision from recent left total hip arthroplasty, most consistent with cellulitis. No drainable fluid collection identified. 2. No evidence of osteomyelitis or hardware loosening.   Electronically Signed   By: Carey Bullocks M.D.   On: Sep 08, 2014 08:26   Dg Pelvis Portable  08/23/2014   CLINICAL DATA:  Bilateral hip pain, left ear replacement 2 months ago  EXAM: PORTABLE PELVIS 1-2 VIEWS  COMPARISON:  06/14/2014  FINDINGS: Left hip arthroplasty in satisfactory position.  Right hip arthroplasty in satisfactory position.  No fracture or dislocation is seen.  IMPRESSION: Bilateral hip arthroplasties, without evidence of complication.   Electronically Signed   By: Charline Bills M.D.   On: 08/23/2014 21:06    Disposition: to home      Discharge Instructions    Discharge patient    Complete by:  As directed             Follow-up Information    Follow up with Kathryne Hitch, MD. Schedule an appointment as soon as possible for a visit in 6 days.   Specialty:  Orthopedic Surgery   Contact information:   [address] [place] [address] [phone]        Signed: Kathryne Hitch 08/27/2014, 7:24 AM

## 2015-01-28 ENCOUNTER — Other Ambulatory Visit: Payer: Self-pay | Admitting: Physician Assistant

## 2015-01-28 DIAGNOSIS — M25552 Pain in left hip: Secondary | ICD-10-CM

## 2015-02-14 ENCOUNTER — Ambulatory Visit
Admission: RE | Admit: 2015-02-14 | Discharge: 2015-02-14 | Disposition: A | Discharge status: Home / Self Care | Payer: Managed Care, Other (non HMO) | Source: Ambulatory Visit | Attending: Physician Assistant | Admitting: Physician Assistant

## 2015-02-14 DIAGNOSIS — M25552 Pain in left hip: Secondary | ICD-10-CM

## 2015-12-03 ENCOUNTER — Other Ambulatory Visit: Payer: Self-pay | Admitting: Orthopaedic Surgery

## 2015-12-03 DIAGNOSIS — M25552 Pain in left hip: Secondary | ICD-10-CM

## 2015-12-13 ENCOUNTER — Other Ambulatory Visit: Payer: Managed Care, Other (non HMO)

## 2016-04-12 ENCOUNTER — Ambulatory Visit (INDEPENDENT_AMBULATORY_CARE_PROVIDER_SITE_OTHER): Payer: Self-pay | Admitting: Orthopaedic Surgery

## 2016-04-29 ENCOUNTER — Ambulatory Visit (INDEPENDENT_AMBULATORY_CARE_PROVIDER_SITE_OTHER): Payer: BLUE CROSS/BLUE SHIELD | Admitting: Orthopaedic Surgery

## 2016-04-29 DIAGNOSIS — Z96642 Presence of left artificial hip joint: Secondary | ICD-10-CM

## 2016-04-29 DIAGNOSIS — M25552 Pain in left hip: Secondary | ICD-10-CM | POA: Diagnosis not present

## 2016-04-29 NOTE — Progress Notes (Signed)
Feels return in follow-up for his left hip. He says some chronic cellulitis but none today. His hip replacement is doing well. He says his biggest problems is a painful scar.  On examination his scar on his left hip there is no redness at all is no evidence of cellulitis. His no warmth. There is no seroma. He does have a painful scar though.  This point on the try topical anti-inflammatories both prescription dose and 9 prescription dose as well as scar creams. We'll see him back in 3 months he is doing overall. I would like an AP and lateral of his left hip at that visit.

## 2016-07-28 ENCOUNTER — Ambulatory Visit (INDEPENDENT_AMBULATORY_CARE_PROVIDER_SITE_OTHER): Payer: BLUE CROSS/BLUE SHIELD | Admitting: Orthopaedic Surgery

## 2016-07-28 ENCOUNTER — Ambulatory Visit (INDEPENDENT_AMBULATORY_CARE_PROVIDER_SITE_OTHER): Payer: Self-pay

## 2016-07-28 DIAGNOSIS — Z96642 Presence of left artificial hip joint: Secondary | ICD-10-CM | POA: Diagnosis not present

## 2016-07-28 NOTE — Progress Notes (Signed)
Office Visit Note   Patient: Jon Rice           Date of Birth: [birth year]           MRN: [id number] Visit Date: 07/28/2016              Requested by: Angelica Chessman, MD [address] [place], [place] [address] PCP: Angelica Chessman., MD   Assessment & Plan: Visit Diagnoses:  1. History of left hip replacement   2. Status post total replacement of left hip     Plan: From my standpoint I do not need to see him back for one year unless he is having problems. I would have an AP and lateral of both hips at that visit. He is scheduled to undergo potentially gastric bypass type of surgery later this summer if he qualifies. Hopefully taking weight office frame will help with his pain.  Follow-Up Instructions: Return in about 1 year (around 07/28/2017).   Orders:  Orders Placed This Encounter  Procedures  . XR HIP UNILAT W OR W/O PELVIS 1V LEFT   No orders of the defined types were placed in this encounter.     Procedures: No procedures performed   Clinical Data: No additional findings.   Subjective: No chief complaint on file. The patient is a 56 year old morbidly obese individual who is now 2 years out from a left total hip replacement through direct anterior approach. He still has pain in that hip with activities. He is over 400 pounds now. He is working with physicians and [place] to consider gastric sleeve surgery. He hurts mainly with weightbearing types of activities. He denies any evidence of infection of the last several months.  HPI  Review of Systems Has any fever, chills, nausea, vomiting.   Objective: Vital Signs: There were no vitals taken for this visit.  Physical Exam His alert and oriented 3 and in no acute distress Ortho Exam I can easily put his hip range of motion but is easy painful for him. There is no evidence of infection soft tissues around his incision or his body folds. Specialty Comments:  No specialty comments  available.  Imaging: Xr Hip Unilat W Or W/o Pelvis 1v Left  Result Date: 07/28/2016 An AP pelvis and a lateral of his left hip shows a well-seated total hip arthroplasty with no complicating features. There is no evidence of loosening. There is no periosteal reaction    PMFS History: Patient Active Problem List   Diagnosis Date Noted  . Hip joint replacement status 08/23/2014  . Cellulitis of left hip 08/23/2014  . Osteoarthritis of left hip 06/14/2014  . Status post total replacement of left hip 06/14/2014   Past Medical History:  Diagnosis Date  . Arthritis   . Cellulitis 08/2014   left hip  . Sleep apnea    cpap- does not know settings     No family history on file.  Past Surgical History:  Procedure Laterality Date  . CARPAL TUNNEL RELEASE     right   . JOINT REPLACEMENT     right hip replacement   . right hand surgery     . right wrist surgery      as a child   . TOTAL HIP ARTHROPLASTY Left 06/14/2014   Procedure: LEFT TOTAL HIP ARTHROPLASTY ANTERIOR APPROACH;  Surgeon: Kathryne Hitch, MD;  Location: [facility];  Service: Orthopedics;  Laterality: Left;   Social History   Occupational History  .  Not on file.   Social History Main Topics  . Smoking status: Never Smoker  . Smokeless tobacco: Former NeurosurgeonUser    Types: Snuff  . Alcohol use Yes     Comment: 1 glass of wine per week   . Drug use: No  . Sexual activity: Not on file

## 2016-08-31 ENCOUNTER — Telehealth (INDEPENDENT_AMBULATORY_CARE_PROVIDER_SITE_OTHER): Payer: Self-pay | Admitting: Orthopaedic Surgery

## 2016-08-31 NOTE — Telephone Encounter (Signed)
Patient called saying that the infection in his hip is coming back and wanted to speak with you. CB # [phone]

## 2016-08-31 NOTE — Telephone Encounter (Signed)
I would like to see him to assess it.

## 2016-08-31 NOTE — Telephone Encounter (Signed)
Want to call him or just bring him in?

## 2016-09-01 ENCOUNTER — Other Ambulatory Visit (INDEPENDENT_AMBULATORY_CARE_PROVIDER_SITE_OTHER): Payer: Self-pay

## 2016-09-01 ENCOUNTER — Ambulatory Visit (INDEPENDENT_AMBULATORY_CARE_PROVIDER_SITE_OTHER): Payer: BLUE CROSS/BLUE SHIELD | Admitting: Orthopaedic Surgery

## 2016-09-01 ENCOUNTER — Other Ambulatory Visit (INDEPENDENT_AMBULATORY_CARE_PROVIDER_SITE_OTHER): Payer: Self-pay | Admitting: Orthopaedic Surgery

## 2016-09-01 DIAGNOSIS — Z96642 Presence of left artificial hip joint: Secondary | ICD-10-CM

## 2016-09-01 DIAGNOSIS — L03116 Cellulitis of left lower limb: Secondary | ICD-10-CM

## 2016-09-01 DIAGNOSIS — M25552 Pain in left hip: Secondary | ICD-10-CM

## 2016-09-01 NOTE — Progress Notes (Signed)
Jon Rice is 2 years out from a left total hip replacement. Someone who occasionally develops cellulitis posterior to his incision and his flank and gluteus area and this clears up with an antibiotic injection and oral antibiotics. The last time this happened was in September. He said he started to feel bad the other day and it got red again. He then went to urgent care and got a injection of Rocephin and then is been on oral antibiotics since then which is amoxicillin. On examination there is some slight warmth well posterior to his incision, with there is no real cellulitis that I can see. He says it is already clearing up with antibiotics.  At this point I would like to obtain a CBC, a sedimentation rate, and a CRP. I obtained x-rays of his left hip just last month and saw no periosteal reaction around the bone. At this point an MRI is definitely warranted to assess the soft tissue and for any evidence of infection around his left hip prosthesis. I will see him back in 2 weeks to see how is doing overall but as soon as I pain the MRI and his blood work we can decide what to do from there. He is supposed to have bariatric surgery by the end of the year. I do feel that his cellulitis recurring may be a result of his weight and body folds we will make sure it's not a result of something going on with a hip replacement itself. Of note his incision is well-healed and shows no evidence of redness. There is no fluctuant area that I can feel.

## 2016-09-01 NOTE — Telephone Encounter (Signed)
Patient coming in this afternoon

## 2016-09-02 LAB — C-REACTIVE PROTEIN: CRP: 93.4 mg/L — ABNORMAL HIGH (ref <8.0)

## 2016-09-02 LAB — CBC WITH DIFFERENTIAL/PLATELET
BASOS PCT: 1 %
Basophils Absolute: 96 cells/uL (ref 0–200)
Eosinophils Absolute: 288 cells/uL (ref 15–500)
Eosinophils Relative: 3 %
HEMATOCRIT: 45.8 % (ref 38.5–50.0)
Hemoglobin: 15.8 g/dL (ref 13.2–17.1)
LYMPHS ABS: 2880 {cells}/uL (ref 850–3900)
Lymphocytes Relative: 30 %
MCH: 32.2 pg (ref 27.0–33.0)
MCHC: 34.5 g/dL (ref 32.0–36.0)
MCV: 93.5 fL (ref 80.0–100.0)
MONO ABS: 864 {cells}/uL (ref 200–950)
MPV: 10 fL (ref 7.5–12.5)
Monocytes Relative: 9 %
NEUTROS ABS: 5472 {cells}/uL (ref 1500–7800)
Neutrophils Relative %: 57 %
PLATELETS: 269 10*3/uL (ref 140–400)
RBC: 4.9 MIL/uL (ref 4.20–5.80)
RDW: 14.2 % (ref 11.0–15.0)
WBC: 9.6 10*3/uL (ref 3.8–10.8)

## 2016-09-02 LAB — SEDIMENTATION RATE: Sed Rate: 11 mm/hr (ref 0–20)

## 2016-09-06 ENCOUNTER — Telehealth (INDEPENDENT_AMBULATORY_CARE_PROVIDER_SITE_OTHER): Payer: Self-pay | Admitting: *Deleted

## 2016-09-06 NOTE — Telephone Encounter (Signed)
Please advise 

## 2016-09-06 NOTE — Telephone Encounter (Signed)
Pt calling for results from bloodwork

## 2016-09-15 ENCOUNTER — Ambulatory Visit (INDEPENDENT_AMBULATORY_CARE_PROVIDER_SITE_OTHER): Payer: BLUE CROSS/BLUE SHIELD | Admitting: Orthopaedic Surgery

## 2016-09-15 ENCOUNTER — Encounter (INDEPENDENT_AMBULATORY_CARE_PROVIDER_SITE_OTHER): Payer: Self-pay

## 2016-09-20 ENCOUNTER — Ambulatory Visit
Admission: RE | Admit: 2016-09-20 | Discharge: 2016-09-20 | Disposition: A | Discharge status: Home / Self Care | Payer: BLUE CROSS/BLUE SHIELD | Source: Ambulatory Visit | Attending: Orthopaedic Surgery | Admitting: Orthopaedic Surgery

## 2016-09-20 DIAGNOSIS — Z96642 Presence of left artificial hip joint: Secondary | ICD-10-CM

## 2016-09-20 DIAGNOSIS — M25552 Pain in left hip: Secondary | ICD-10-CM

## 2016-09-27 ENCOUNTER — Ambulatory Visit (INDEPENDENT_AMBULATORY_CARE_PROVIDER_SITE_OTHER): Payer: BLUE CROSS/BLUE SHIELD | Admitting: Orthopaedic Surgery

## 2016-10-07 ENCOUNTER — Ambulatory Visit (INDEPENDENT_AMBULATORY_CARE_PROVIDER_SITE_OTHER): Payer: BLUE CROSS/BLUE SHIELD | Admitting: Physician Assistant

## 2016-11-29 ENCOUNTER — Encounter (INDEPENDENT_AMBULATORY_CARE_PROVIDER_SITE_OTHER): Payer: Self-pay | Admitting: Physician Assistant

## 2016-11-29 ENCOUNTER — Ambulatory Visit (INDEPENDENT_AMBULATORY_CARE_PROVIDER_SITE_OTHER): Payer: BLUE CROSS/BLUE SHIELD | Admitting: Physician Assistant

## 2016-11-29 VITALS — Ht 70.0 in | Wt >= 6400 oz

## 2016-11-29 DIAGNOSIS — L03116 Cellulitis of left lower limb: Secondary | ICD-10-CM | POA: Diagnosis not present

## 2016-11-29 NOTE — Progress Notes (Signed)
Office Visit Note   Patient: Jon Rice           Date of Birth: 05/07/1961           MRN: [id number] Visit Date: 11/29/2016              Requested by: Angelica Chessman, MD [address] [place], [place] [address] PCP: Angelica Chessman, MD   Assessment & Plan: Visit Diagnoses:  1. Cellulitis of left hip     Plan: We will see him back in 6 months to check his hip wound area. He'll continue his penicillin 500 mg twice a day. Follow up with [place] sooner if there is any signs of infection/cellulitis.  Follow-Up Instructions: Return in about 6 months (around 06/01/2017) for wound check.   Orders:  No orders of the defined types were placed in this encounter.  No orders of the defined types were placed in this encounter.     Procedures: No procedures performed   Clinical Data: No additional findings.   Subjective: Chief Complaint  Patient presents with  . Left Hip - Follow-up    HPI Jon Rice returns today follow-up of his MRI of his left hip 4 09/20/2016. Also to go over her lab work. He seen Dr.Bhusal medicine and infectious disease physician at [facility]. I placed Jon Rice on penicillin 500 mg twice a day for long-term suppression. Jon Rice  is due to undergo gastric sleeve surgery in September. He's had no recent fevers chills or cellulitis. He's walking for exercise and playing some golf. MRI dated 09/20/2016 is reviewed with patient and his shoulder shows some no deep-seated infection either hip. No evidence of loosening however images are degraded  due to artifact. Sedimentation rate CBC within normal limits. CRP was elevated at 93.4 mg/L.  Review of Systems Please see history of present illness otherwise negative  Objective: Vital Signs: Ht 5\' 10"  (1.778 m)   Wt (!) 412 lb (186.9 kg)   BMI 59.12 kg/m   Physical Exam Gen. well-developed well-nourished male in no acute distress. Mood and Affect appropriate Ortho Exam Bilateral hips and no  evidence of cellulitis. Left hip surgical incision is well-healed no dehiscence, ecchymosis or erythema. Specialty Comments:  No specialty comments available.  Imaging: No results found.   PMFS History: Patient Active Problem List   Diagnosis Date Noted  . Hip joint replacement status 08/23/2014  . Cellulitis of left hip 08/23/2014  . Osteoarthritis of left hip 06/14/2014  . Status post total replacement of left hip 06/14/2014   Past Medical History:  Diagnosis Date  . Arthritis   . Cellulitis 08/2014   left hip  . Sleep apnea    cpap- does not know settings     No family history on file.  Past Surgical History:  Procedure Laterality Date  . CARPAL TUNNEL RELEASE     right   . JOINT REPLACEMENT     right hip replacement   . right hand surgery     . right wrist surgery      as a child   . TOTAL HIP ARTHROPLASTY Left 06/14/2014   Procedure: LEFT TOTAL HIP ARTHROPLASTY ANTERIOR APPROACH;  Surgeon: Kathryne Hitch, MD;  Location: [facility];  Service: Orthopedics;  Laterality: Left;   Social History   Occupational History  . Not on file.   Social History Main Topics  . Smoking status: Never Smoker  . Smokeless tobacco: Former Neurosurgeon  Types: Snuff  . Alcohol use Yes     Comment: 1 glass of wine per week   . Drug use: No  . Sexual activity: Not on file

## 2017-07-18 ENCOUNTER — Encounter (INDEPENDENT_AMBULATORY_CARE_PROVIDER_SITE_OTHER): Payer: Self-pay | Admitting: Physician Assistant

## 2017-07-18 ENCOUNTER — Ambulatory Visit (INDEPENDENT_AMBULATORY_CARE_PROVIDER_SITE_OTHER): Payer: BLUE CROSS/BLUE SHIELD | Admitting: Physician Assistant

## 2017-07-18 ENCOUNTER — Ambulatory Visit (INDEPENDENT_AMBULATORY_CARE_PROVIDER_SITE_OTHER): Payer: BLUE CROSS/BLUE SHIELD

## 2017-07-18 VITALS — Ht 70.0 in | Wt 319.0 lb

## 2017-07-18 DIAGNOSIS — M7062 Trochanteric bursitis, left hip: Secondary | ICD-10-CM | POA: Diagnosis not present

## 2017-07-18 DIAGNOSIS — M25552 Pain in left hip: Secondary | ICD-10-CM

## 2017-07-18 MED ORDER — LIDOCAINE HCL 1 % IJ SOLN
3.0000 mL | INTRAMUSCULAR | Status: AC | PRN
  Administered 2017-07-18: 3 mL

## 2017-07-18 MED ORDER — METHYLPREDNISOLONE ACETATE 40 MG/ML IJ SUSP
40.0000 mg | INTRAMUSCULAR | Status: AC | PRN
  Administered 2017-07-18: 40 mg via INTRA_ARTICULAR

## 2017-07-18 NOTE — Progress Notes (Addendum)
Office Visit Note   Patient: Jon MassyRobert B Osborn           Date of Birth: 06/12/1960           MRN: [id number] Visit Date: 07/18/2017              Requested by: Angelica ChessmanAguiar, Rafaela M, MD [address] [place], [place] [id number] PCP: Angelica ChessmanAguiar, Rafaela M, MD   Assessment & Plan: Visit Diagnoses:  1. Pain in left hip   2. Trochanteric bursitis, left hip     Plan: Have patient work on IT band stretching exercises that shown in did have the patient demonstrate these to me.  I asked him to return in 2 weeks check his progress lack of.  He would prefer to avoid his high co-pay and therefore he will call us and let us know how the injection did.  Definitely will return if there is any signs of infections or questions his pain becomes worse.    Follow-Up Instructions: Return if symptoms worsen or fail to improve.   Orders:  Orders Placed This Encounter  Procedures  . Large Joint Inj: L greater trochanter  . XR HIP UNILAT W OR W/O PELVIS 2-3 VIEWS LEFT   Meds ordered this encounter  Medications  . lidocaine (XYLOCAINE) 1 % (with pres) injection 3 mL  . methylPREDNISolone acetate (DEPO-MEDROL) injection 40 mg      Procedures: Large Joint Inj: L greater trochanter on 07/18/2017 9:46 PM Indications: pain Details: 22 G 1.5 in needle, lateral approach  Arthrogram: No  Medications: 3 mL lidocaine 1 %; 40 mg methylPREDNISolone acetate 40 MG/ML Outcome: tolerated well, no immediate complications Procedure, treatment alternatives, risks and benefits explained, specific risks discussed. Consent was given by the patient. Immediately prior to procedure a time out was called to verify the correct patient, procedure, equipment, support staff and site/side marked as required. Patient was prepped and draped in the usual sterile fashion.       Clinical Data: No additional findings.   Subjective: Chief Complaint  Patient presents with  . Left Hip - Follow-up, Pain    HPI Mr.  Jon Rice returns today follow-up status post left total hip arthroplasty is now 3 years postop.  He continues to have some pain about the hip.  Occasionally pain does radiate down to his ankle area but most of time pain is in an around the hip.  Has increased pain at night so unable to lay on the left hip for any prolonged period of time.  States he has had no signs of infection, cellulitis about the left hip.  He has been released from infectious disease to follow-up as needed as of 07/07/2017.  However he is still on penicillin twice daily.  Is taking sleep aid at night to allow him to sleep due to the hip pain. Patient has lost a total of 95 pounds since undergoing gastric sleeve procedure.  He thought that the weight loss would actually help his hip however he continues to have pain.  Review of Systems Denies fever or chills . See HPI  Objective: Vital Signs: Ht 5\' 10"  (1.778 m)   Wt (!) 319 lb (144.7 kg)   BMI 45.77 kg/m   Physical Exam  Constitutional: He is oriented to person, place, and time. He appears well-developed and well-nourished. No distress.  Pulmonary/Chest: Effort normal.  Neurological: He is alert and oriented to person, place, and time.  Skin: He is not diaphoretic.  Psychiatric:  He has a normal mood and affect.    Ortho Exam Left hip good range of motion with internal and external rotation extremes cause some discomfort laterally.  He has tenderness over the left greater trochanteric region.  Surgical incisions well-healed no signs of infection.  Specialty Comments:  No specialty comments available.  Imaging: AP pelvis and lateral view of the left hip: No acute fractures.  Bilateral hips well located.  Left total hip is well seated no signs of infection.  Right [place] hip arthroplasty appears well seated.   PMFS History: Patient Active Problem List   Diagnosis Date Noted  . Hip joint replacement status 08/23/2014  . Cellulitis of left hip 08/23/2014  .  Osteoarthritis of left hip 06/14/2014  . Status post total replacement of left hip 06/14/2014   Past Medical History:  Diagnosis Date  . Arthritis   . Cellulitis 08/2014   left hip  . Sleep apnea    cpap- does not know settings     History reviewed. No pertinent family history.  Past Surgical History:  Procedure Laterality Date  . CARPAL TUNNEL RELEASE     right   . JOINT REPLACEMENT     right hip replacement   . right hand surgery     . right wrist surgery      as a child   . TOTAL HIP ARTHROPLASTY Left 06/14/2014   Procedure: LEFT TOTAL HIP ARTHROPLASTY ANTERIOR APPROACH;  Surgeon: Kathryne Hitch, MD;  Location: [facility];  Service: Orthopedics;  Laterality: Left;   Social History   Occupational History  . Not on file  Tobacco Use  . Smoking status: Never Smoker  . Smokeless tobacco: Former Neurosurgeon    Types: Snuff  Substance and Sexual Activity  . Alcohol use: Yes    Comment: 1 glass of wine per week   . Drug use: No  . Sexual activity: Not on file

## 2017-07-27 ENCOUNTER — Ambulatory Visit (INDEPENDENT_AMBULATORY_CARE_PROVIDER_SITE_OTHER): Payer: BLUE CROSS/BLUE SHIELD | Admitting: Orthopaedic Surgery

## 2017-08-02 IMAGING — MR MR HIP*L* W/O CM
4 of 7 series · 14 of 40 positions shown · non-contrast
Comparison: 07/28/2016 pelvic radiographs

CLINICAL DATA: Left hip pain, status post bilateral hip
replacements x2 years. Currently on antibiotics for possible
infection in incision.

EXAM:
MR OF THE LEFT HIP WITHOUT CONTRAST
TECHNIQUE: Multiplanar, multisequence MR imaging was performed. No intravenous
contrast was administered.

[Series 3: T1 · coronal · 4.0mm · 0.62mm/px · 5 of 30 slices shown (1 of 2)]
[im 1/30]
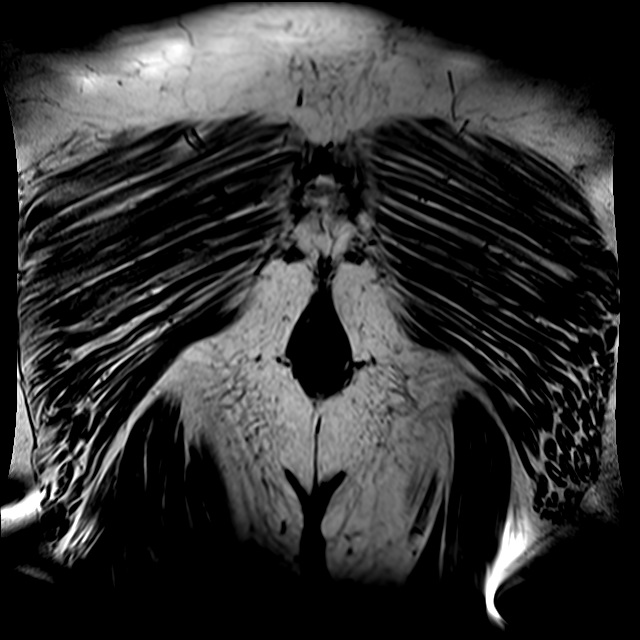
[im 6/30]
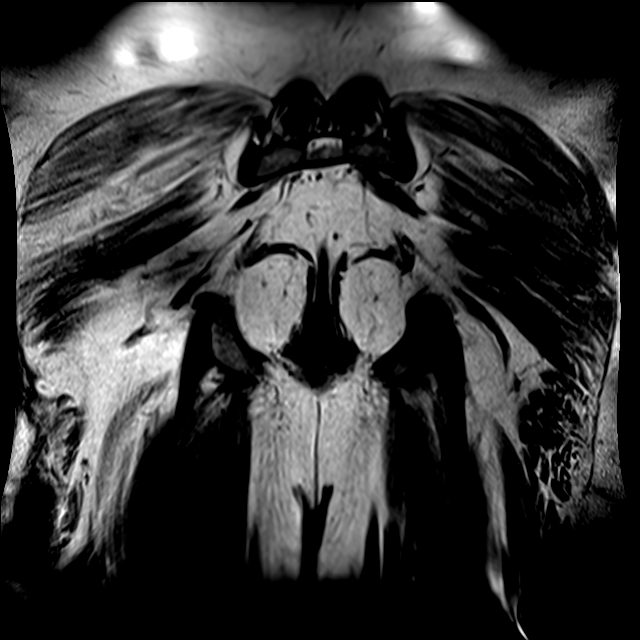
[im 12/30]
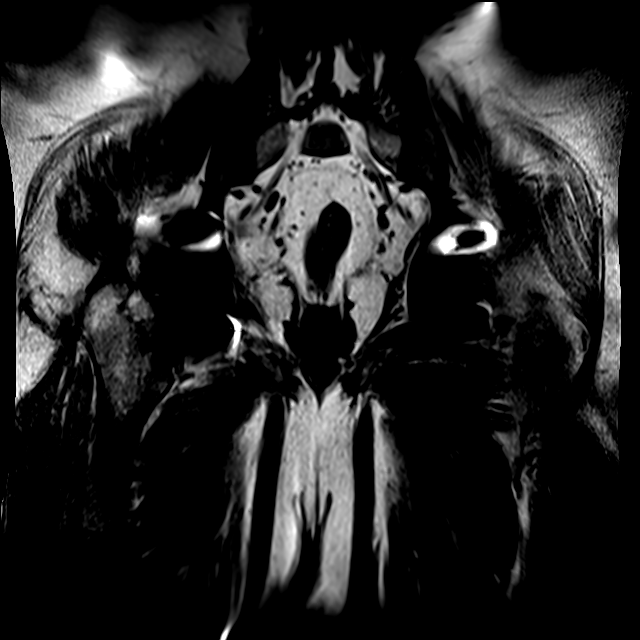
[im 18/30]
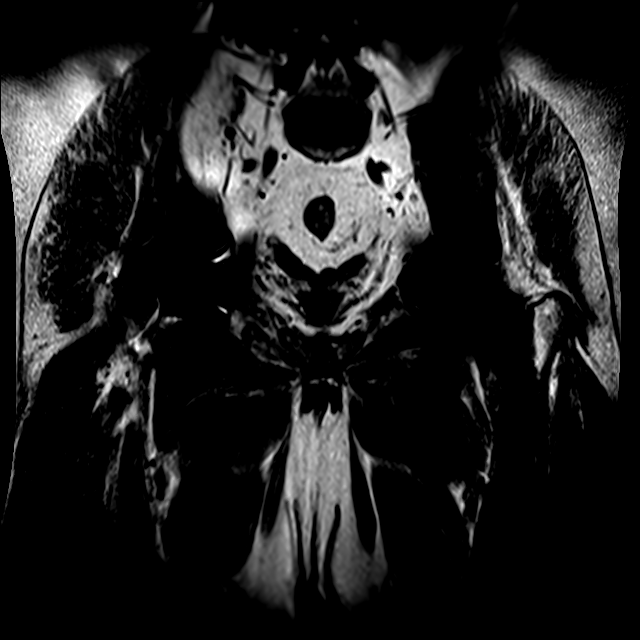
[im 30/30]
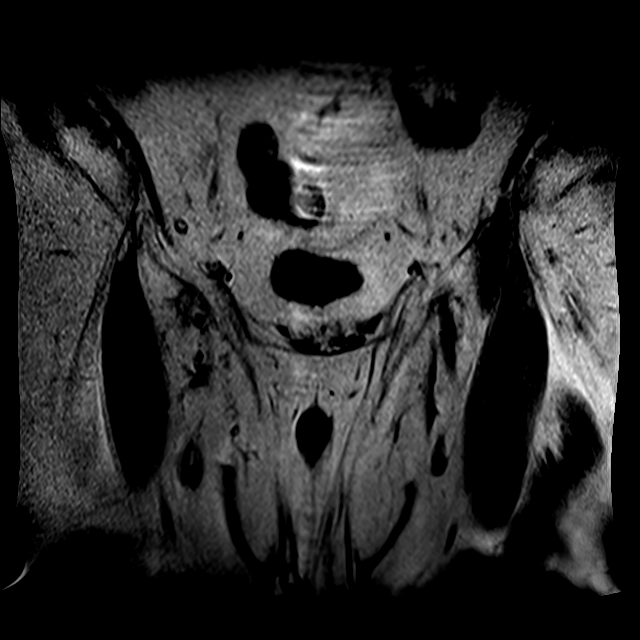

[Series 4: STIR · coronal · 4.0mm · 0.78mm/px · 3 of 30 slices shown]
[im 1/30]
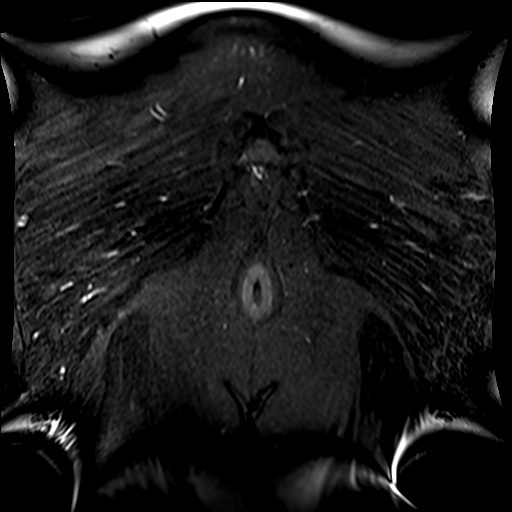
[im 15/30]
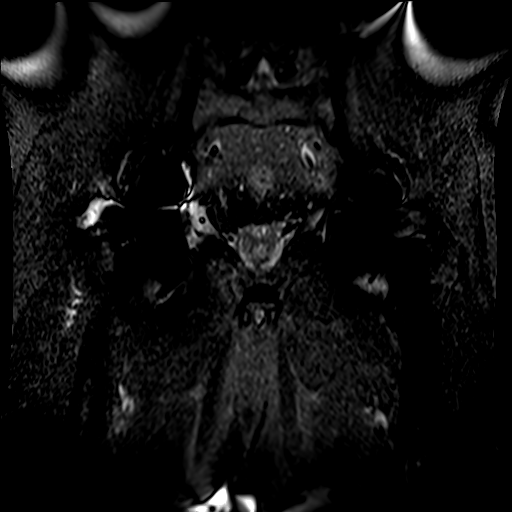
[im 30/30]
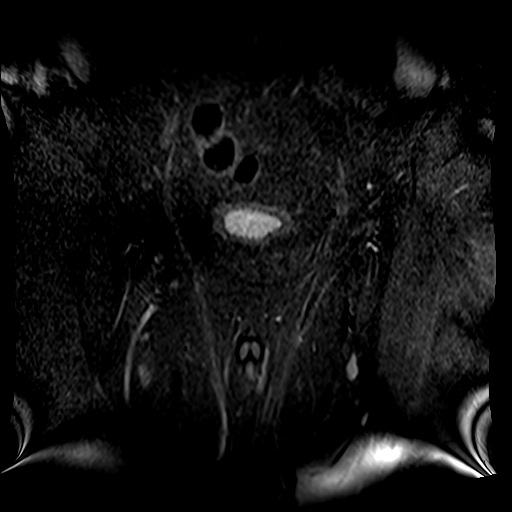

[Series 5: T1 · axial · 4.0mm · 0.74mm/px · z∈[+15,+176]mm · 3 of 47 slices shown (2 of 2)]
[im 8/47]
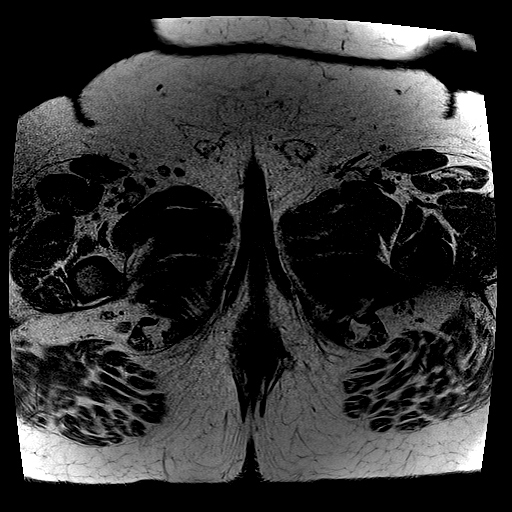
[im 24/47]
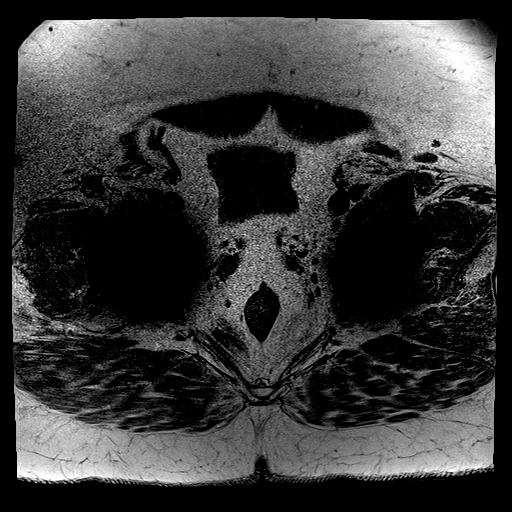
[im 39/47]
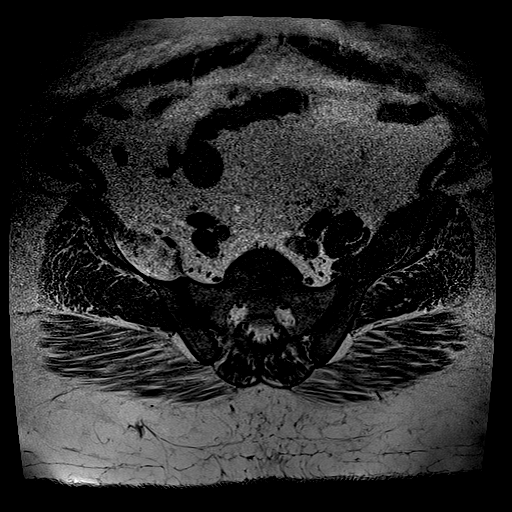

[Series 6: T2 · axial · 4.0mm · 0.49mm/px · z∈[+15,+176]mm · 3 of 47 slices shown]
[im 8/47]
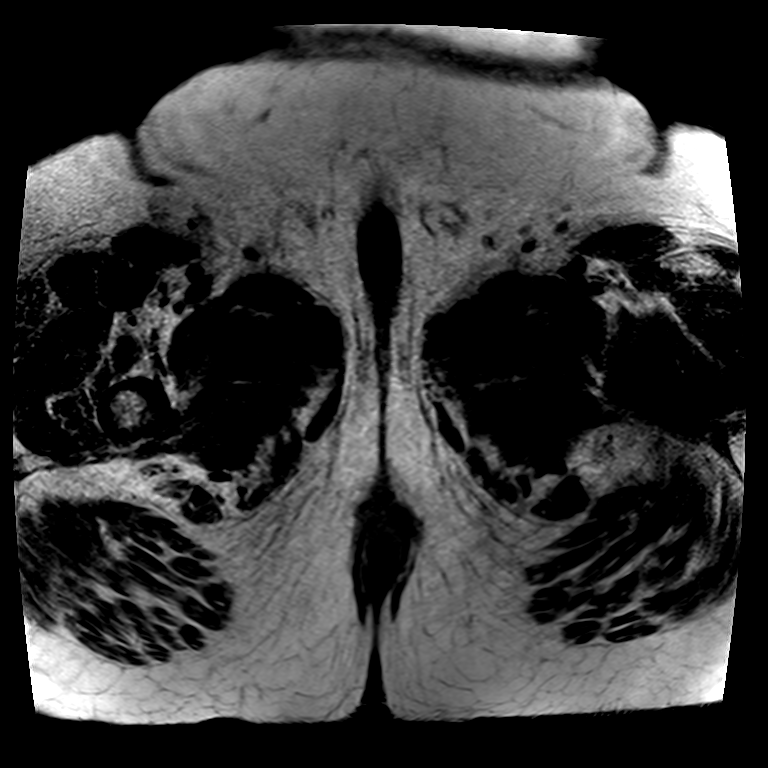
[im 24/47]
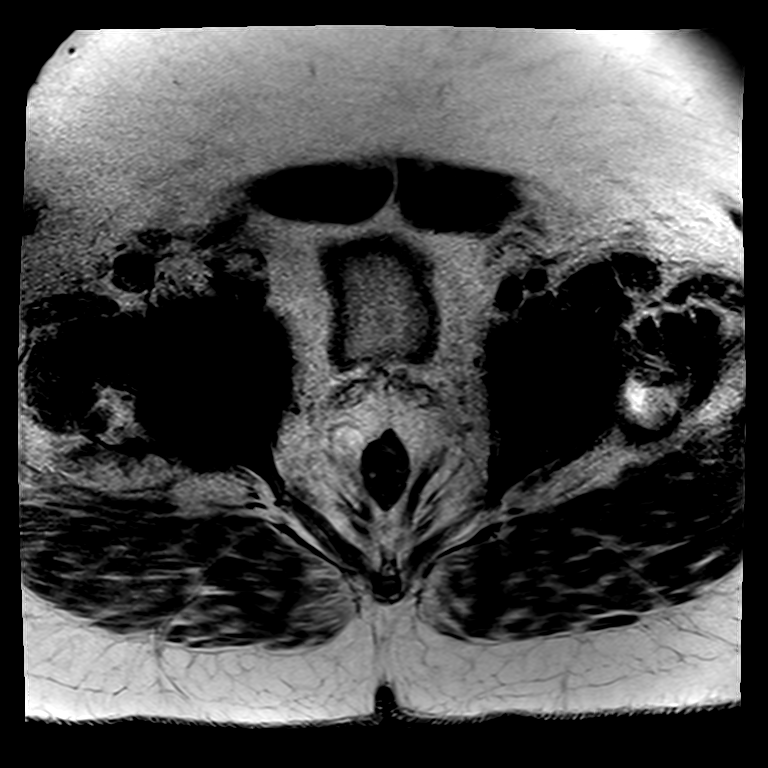
[im 39/47]
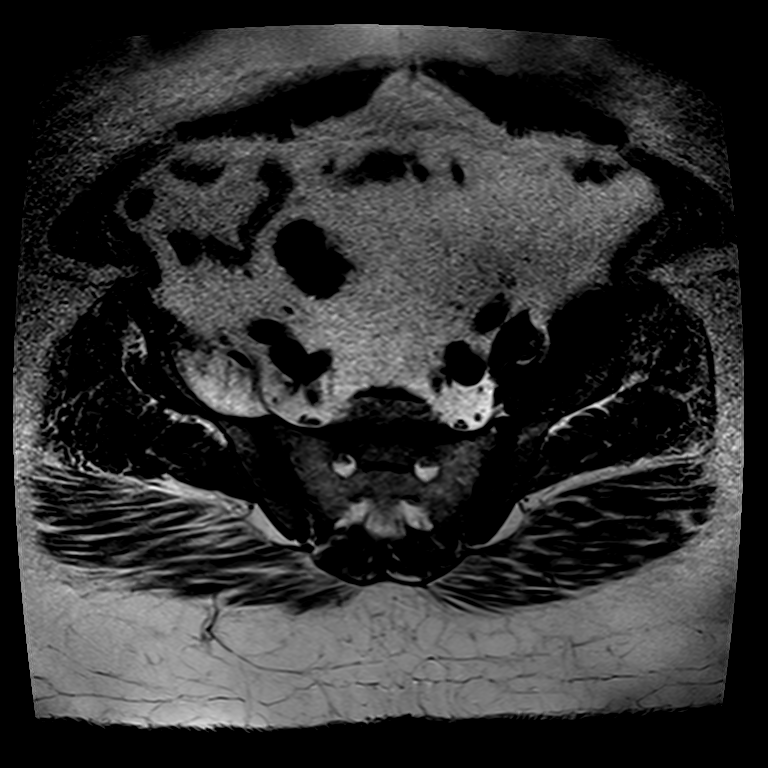

[14 of 40 positions shown; findings below may reference images not displayed]

FINDINGS: Limited by patient body habitus.

Bones: Susceptibility artifacts from both hips limit assessment in
addition to patient body habitus. No suspicious osseous findings to
suggest fracture. No dislocations.

Articular cartilage and labrum

Articular cartilage:  Not visualized

Labrum:  Not applicable

Joint or bursal effusion

Joint effusion:  Trace joint effusions bilaterally.

Bursae:  No bursal fluid.

Muscles and tendons

Muscles and tendons:  No evidence of pyomyositis or hematoma.

Other findings

Miscellaneous:  None
IMPRESSION: Bilateral hip arthroplasties limit assessment due to susceptibility
artifacts. There appear to be trace bilateral effusions without
significant joint fluid. No soft tissue masses or soft tissue
abscess. No definite fracture.

## 2017-08-09 ENCOUNTER — Telehealth (INDEPENDENT_AMBULATORY_CARE_PROVIDER_SITE_OTHER): Payer: Self-pay | Admitting: Physician Assistant

## 2017-08-09 NOTE — Telephone Encounter (Signed)
Pt wife called to update us about the status of pt pain. The cortisone didn't help with left hip pain

## 2017-08-09 NOTE — Telephone Encounter (Signed)
Next step?

## 2017-08-09 NOTE — Telephone Encounter (Signed)
Patient will work on IT band stretching with trainer and call us if things do not improve or get worse.

## 2022-12-23 ENCOUNTER — Telehealth: Payer: Self-pay | Admitting: Physician Assistant

## 2022-12-23 NOTE — Telephone Encounter (Signed)
Received call from patient wife, Claris G[place]. [place] to release medical records for where he is now. I emailed form to her [email or web address]. Will process when received completed form. Ph [phone]

## 2023-01-18 ENCOUNTER — Telehealth: Payer: Self-pay | Admitting: Physician Assistant

## 2023-01-18 NOTE — Telephone Encounter (Signed)
Received vm from [place], pts wife. Checking if medical records have been faxed to [facility]. IC,lmvm advised that records faxed on 8/6 to the fax number provided on the release form.

## 2023-01-20 ENCOUNTER — Telehealth: Payer: Self-pay | Admitting: Orthopaedic Surgery

## 2023-01-20 NOTE — Telephone Encounter (Signed)
Received vm from [place], pts wife stating Jon Rice does not have his medical records that we faxed on 8/6, she asks that we refax records. I faxed records [phone]. IC advised that records have been refaxed.

## 2023-02-07 ENCOUNTER — Telehealth: Payer: Self-pay | Admitting: Orthopaedic Surgery

## 2023-02-07 NOTE — Telephone Encounter (Signed)
Received call from pts wife stating records still not received by [facility] that was last refaxed on 01/20/23. I advised will refax records on a land line fax. I will call back if records not transmitted okay. Records faxed on land fax to [phone]

## 2023-02-08 ENCOUNTER — Telehealth: Payer: Self-pay | Admitting: Orthopaedic Surgery

## 2023-02-08 NOTE — Telephone Encounter (Signed)
Received call from patients wife, Claris Gower stating Jon Rice does not treat for patients hip problem and now needs the records faxed to [facility] at [phone]. I faxed records and received confirmation
